# Patient Record
Sex: Female | Born: 1950 | Race: White | Hispanic: No | Marital: Married | Smoking: Never smoker
Health system: Southern US, Community
[De-identification: ages and names within clinical notes are randomized; demographics above are authoritative.]

## PROBLEM LIST (undated history)

## (undated) DIAGNOSIS — M1990 Unspecified osteoarthritis, unspecified site: Secondary | ICD-10-CM

## (undated) DIAGNOSIS — J45909 Unspecified asthma, uncomplicated: Secondary | ICD-10-CM

## (undated) HISTORY — PX: GASTRECTOMY: SHX58

## (undated) HISTORY — PX: FOOT SURGERY: SHX648

## (undated) HISTORY — PX: ABDOMINAL HYSTERECTOMY: SHX81

## (undated) HISTORY — PX: HAND SURGERY: SHX662

---

## 1999-01-19 ENCOUNTER — Encounter: Admission: RE | Admit: 1999-01-19 | Discharge: 1999-01-19 | Payer: Self-pay | Admitting: *Deleted

## 2008-12-23 ENCOUNTER — Encounter: Admission: RE | Admit: 2008-12-23 | Discharge: 2008-12-23 | Payer: Self-pay | Admitting: Unknown Physician Specialty

## 2010-06-13 IMAGING — CR DG HAND COMPLETE 3+V*L*
3 series · 3 of 3 positions shown · non-contrast
Comparison: None

CLINICAL DATA: Left hand pain and swelling.

LEFT HAND - COMPLETE 3+ VIEW

[view not recorded (1 of 3)]
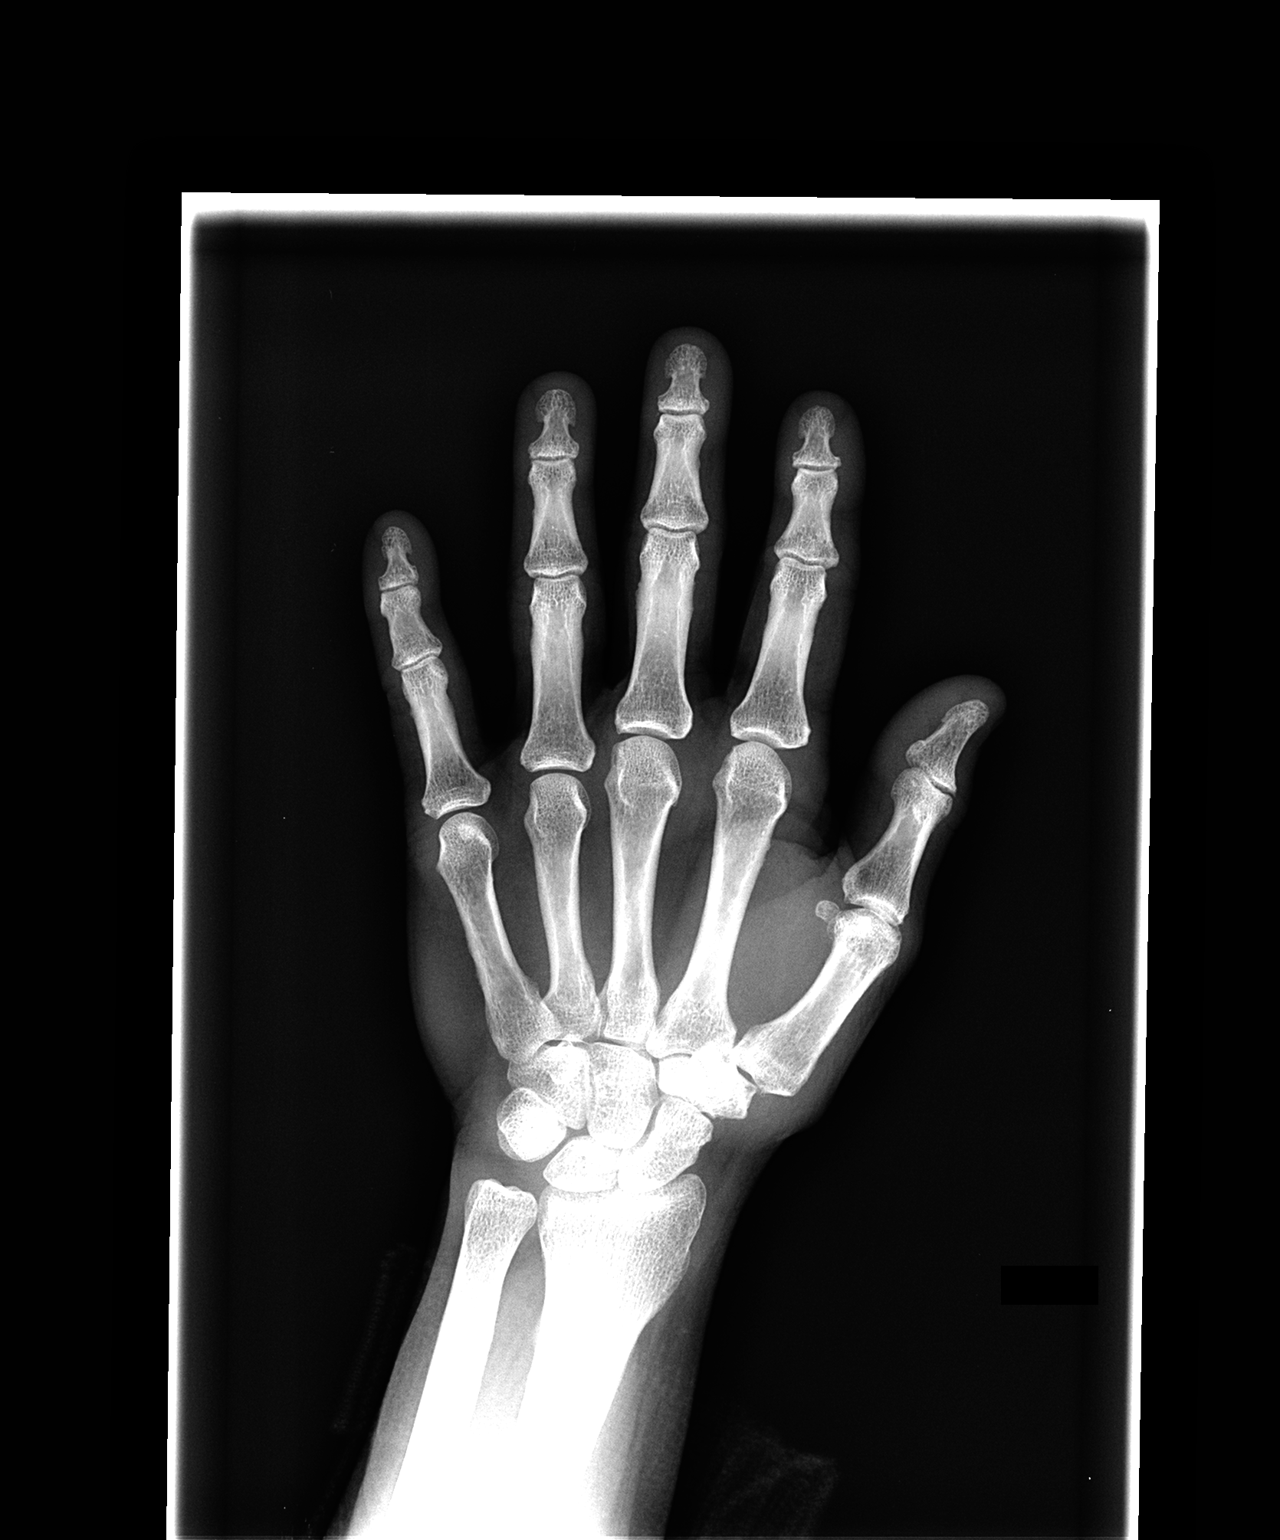

[view not recorded (2 of 3)]
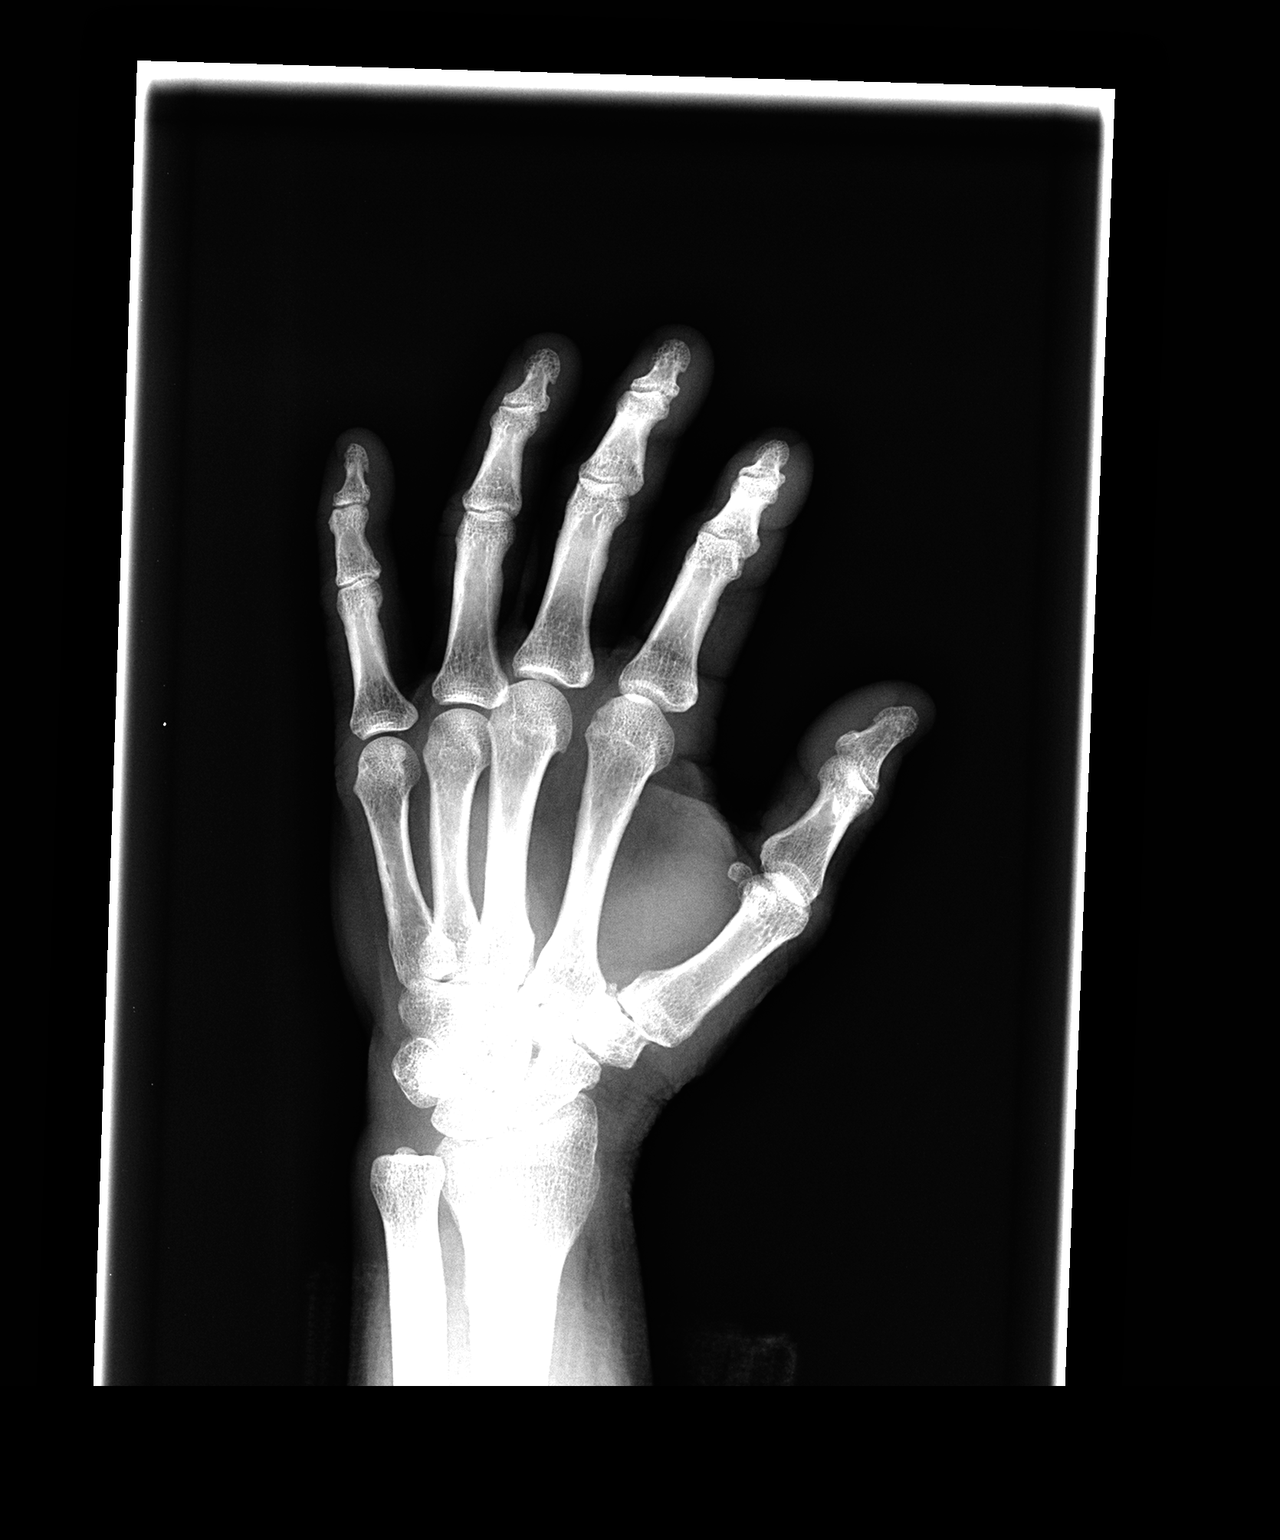

[view not recorded (3 of 3)]
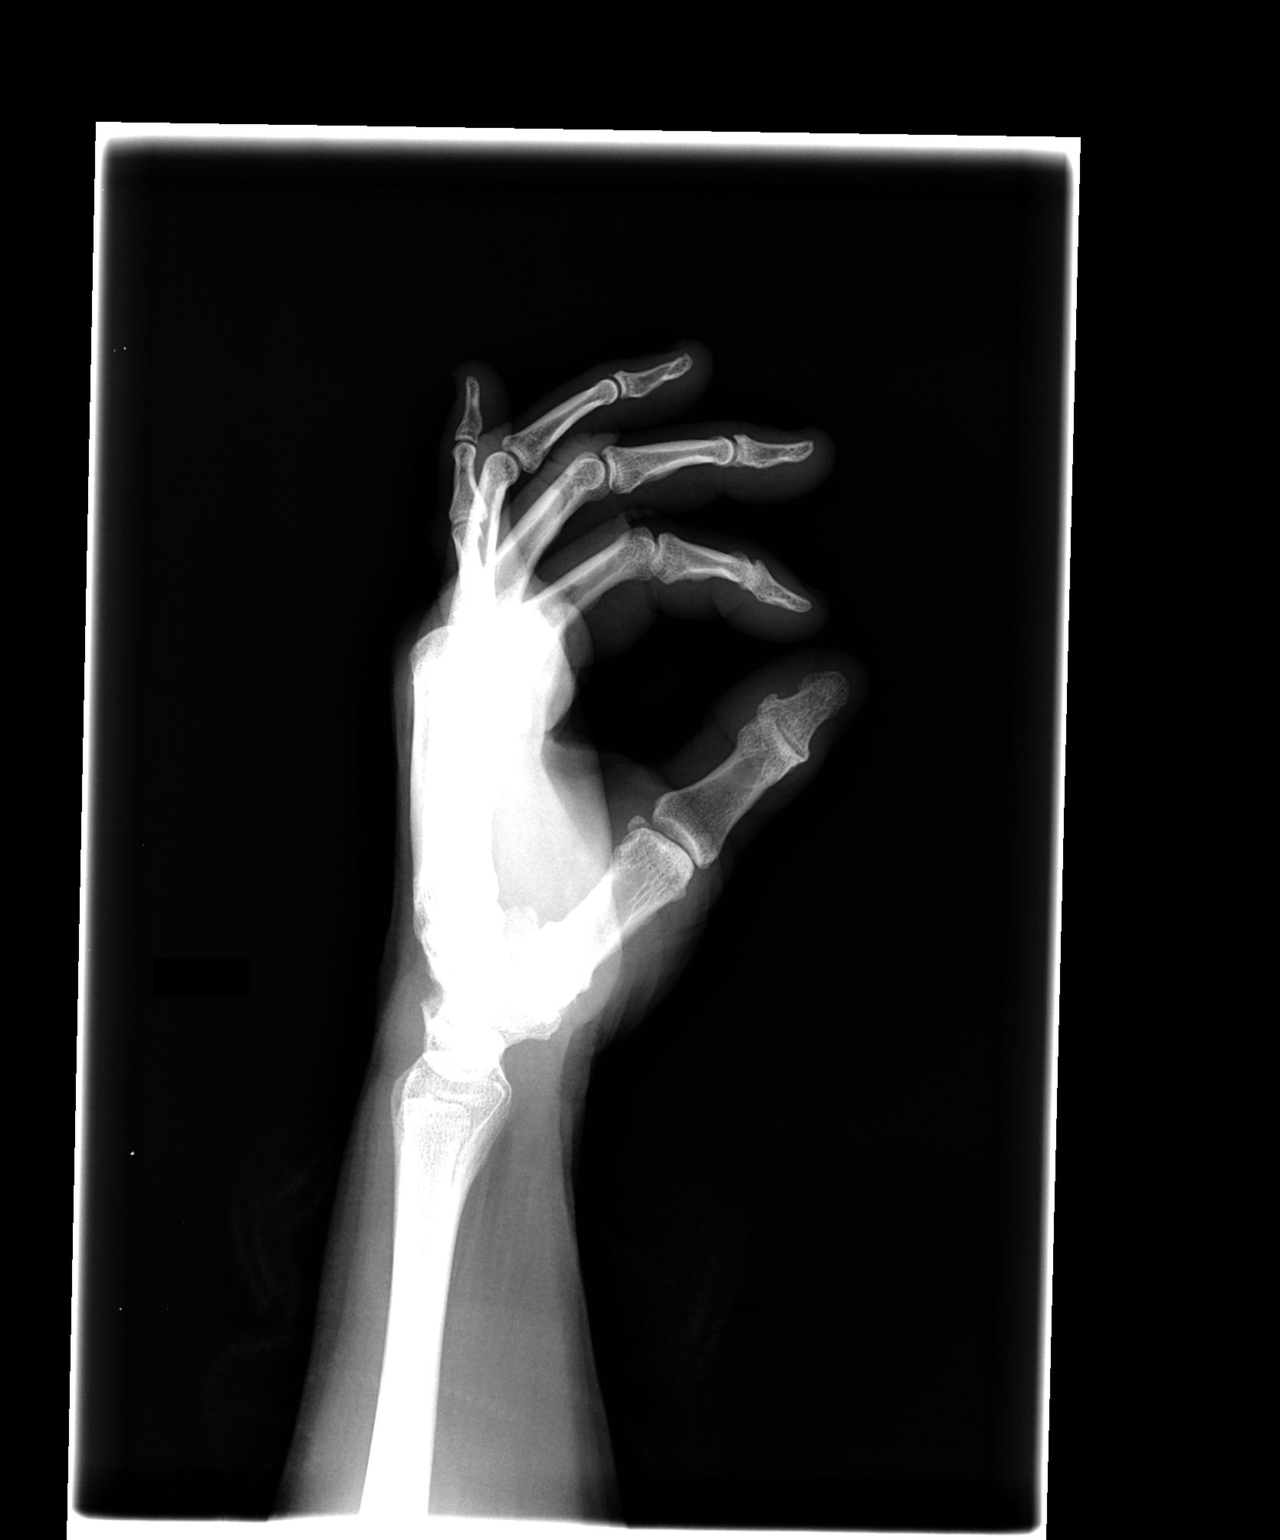

[3 of 3 positions shown; findings below may reference images not displayed]

FINDINGS: No acute osseous or joint abnormality.  There are mild
degenerative changes at the first carpometacarpal joint.
IMPRESSION: 1.  No acute findings.
2.  Mild first carpometacarpal joint osteoarthritis.

## 2014-11-29 ENCOUNTER — Emergency Department (INDEPENDENT_AMBULATORY_CARE_PROVIDER_SITE_OTHER): Payer: BC Managed Care – PPO

## 2014-11-29 ENCOUNTER — Encounter: Payer: Self-pay | Admitting: Emergency Medicine

## 2014-11-29 ENCOUNTER — Emergency Department
Admission: EM | Admit: 2014-11-29 | Discharge: 2014-11-29 | Disposition: A | Discharge status: Home / Self Care | Payer: BC Managed Care – PPO | Source: Home / Self Care | Attending: Family Medicine | Admitting: Family Medicine

## 2014-11-29 DIAGNOSIS — M5137 Other intervertebral disc degeneration, lumbosacral region: Secondary | ICD-10-CM

## 2014-11-29 DIAGNOSIS — S20211A Contusion of right front wall of thorax, initial encounter: Secondary | ICD-10-CM

## 2014-11-29 DIAGNOSIS — M545 Low back pain: Secondary | ICD-10-CM

## 2014-11-29 DIAGNOSIS — M5135 Other intervertebral disc degeneration, thoracolumbar region: Secondary | ICD-10-CM

## 2014-11-29 DIAGNOSIS — M47896 Other spondylosis, lumbar region: Secondary | ICD-10-CM

## 2014-11-29 DIAGNOSIS — R0781 Pleurodynia: Secondary | ICD-10-CM

## 2014-11-29 DIAGNOSIS — T1490XA Injury, unspecified, initial encounter: Secondary | ICD-10-CM

## 2014-11-29 DIAGNOSIS — T1490 Injury, unspecified: Secondary | ICD-10-CM

## 2014-11-29 HISTORY — DX: Unspecified osteoarthritis, unspecified site: M19.90

## 2014-11-29 HISTORY — DX: Unspecified asthma, uncomplicated: J45.909

## 2014-11-29 MED ORDER — KETOROLAC TROMETHAMINE 60 MG/2ML IM SOLN
30.0000 mg | Freq: Once | INTRAMUSCULAR | Status: AC
  Administered 2014-11-29: 30 mg via INTRAMUSCULAR

## 2014-11-29 MED ORDER — BENZONATATE 200 MG PO CAPS: 200.0000 mg | ORAL_CAPSULE | Freq: Every day | ORAL | Status: AC

## 2014-11-29 NOTE — Discharge Instructions (Signed)
Wear rib belt.  Apply ice pack for 20 to 30 minutes, 3 to 4 times daily  Continue until pain decreases.  May take Tylenol for pain.   Chest Contusion A chest contusion is a deep bruise on your chest area. Contusions are the result of an injury that caused bleeding under the skin. A chest contusion may involve bruising of the skin, muscles, or ribs. The contusion may turn blue, purple, or yellow. Minor injuries will give you a painless contusion, but more severe contusions may stay painful and swollen for a few weeks. CAUSES  A contusion is usually caused by a blow, trauma, or direct force to an area of the body. SYMPTOMS   Swelling and redness of the injured area.  Discoloration of the injured area.  Tenderness and soreness of the injured area.  Pain. DIAGNOSIS  The diagnosis can be made by taking a history and performing a physical exam. An X-ray, CT scan, or MRI may be needed to determine if there were any associated injuries, such as broken bones (fractures) or internal injuries. TREATMENT  Often, the best treatment for a chest contusion is resting, icing, and applying cold compresses to the injured area. Deep breathing exercises may be recommended to reduce the risk of pneumonia. Over-the-counter medicines may also be recommended for pain control. HOME CARE INSTRUCTIONS   Put ice on the injured area.  Put ice in a plastic bag.  Place a towel between your skin and the bag.  Leave the ice on for 15-20 minutes, 03-04 times a day.  Only take over-the-counter or prescription medicines as directed by your caregiver. Your caregiver may recommend avoiding anti-inflammatory medicines (aspirin, ibuprofen, and naproxen) for 48 hours because these medicines may increase bruising.  Rest the injured area.  Perform deep-breathing exercises as directed by your caregiver.  Stop smoking if you smoke.  Do not lift objects over 5 pounds (2.3 kg) for 3 days or longer if recommended by your  caregiver. SEEK IMMEDIATE MEDICAL CARE IF:   You have increased bruising or swelling.  You have pain that is getting worse.  You have difficulty breathing.  You have dizziness, weakness, or fainting.  You have blood in your urine or stool.  You cough up or vomit blood.  Your swelling or pain is not relieved with medicines. MAKE SURE YOU:   Understand these instructions.  Will watch your condition.  Will get help right away if you are not doing well or get worse. Document Released: 09/05/2001 Document Revised: 09/04/2012 Document Reviewed: 06/03/2012 [facility] Patient Information 2015 [place], [place]. This information is not intended to replace advice given to you by your health care provider. Make sure you discuss any questions you have with your health care provider.

## 2014-11-29 NOTE — ED Notes (Signed)
Reports falling down stairs during move session yesterday; pain along right side of pain has gotten progressively worse; took acetominophen 1 GM this morning.

## 2014-11-29 NOTE — ED Provider Notes (Signed)
CSN: [id number]     Arrival date & time 11/29/14  1143 History   First MD Initiated Contact with Patient 11/29/14 1258     Chief Complaint  Patient presents with  . Back Pain      HPI Comments: Patient slipped down about 6 stair treads yesterday landing on her right side, back, shoulder, and right lower ribs.  Last night she felt a popping sensation in her right chest and now has pain that is worse with movement and coughing.  No shortness of breath.  No fevers, chills, and sweats       Patient is a 63 y.o. female presenting with chest pain. The history is provided by the patient.  Chest Pain Pain location:  R lateral chest Pain quality: sharp and stabbing   Pain radiates to:  Does not radiate Pain severity:  Moderate Onset quality:  Sudden Duration:  1 day Timing:  Constant Progression:  Unchanged Chronicity:  New Context: breathing, lifting, movement and at rest   Relieved by:  Nothing Worsened by:  Coughing, movement and deep breathing Ineffective treatments: Tylenol. Associated symptoms: cough   Associated symptoms: no abdominal pain, no back pain, no diaphoresis, no dizziness, no dysphagia, no fever, no nausea, no numbness, no palpitations and no shortness of breath     Past Medical History  Diagnosis Date  . Arthritis   . Asthma    Past Surgical History  Procedure Laterality Date  . Hand surgery Left   . Abdominal hysterectomy    . Foot surgery Bilateral   . Gastrectomy     Family History  Problem Relation Age of Onset  . Cancer Mother   . Heart failure Father    History  Substance Use Topics  . Smoking status: Never Smoker   . Smokeless tobacco: Not on file  . Alcohol Use: No   OB History    No data available     Review of Systems  Constitutional: Negative for fever and diaphoresis.  HENT: Negative for trouble swallowing.   Respiratory: Positive for cough. Negative for shortness of breath.   Cardiovascular: Positive for chest pain. Negative for  palpitations.  Gastrointestinal: Negative for nausea and abdominal pain.  Musculoskeletal: Negative for back pain.  Neurological: Negative for dizziness and numbness.  All other systems reviewed and are negative.   Allergies  Phenothiazines  Home Medications   Prior to Admission medications   Medication Sig Start Date End Date Taking? Authorizing Provider  alendronate (FOSAMAX) 70 MG tablet Take 70 mg by mouth once a week. Take with a full glass of water on an empty stomach.   Yes Historical Provider, MD  celecoxib (CELEBREX) 200 MG capsule Take 200 mg by mouth 2 (two) times daily.   Yes Historical Provider, MD  fluticasone (FLOVENT DISKUS) 50 MCG/BLIST diskus inhaler Inhale 1 puff into the lungs 2 (two) times daily.   Yes Historical Provider, MD  montelukast (SINGULAIR) 10 MG tablet Take 10 mg by mouth at bedtime.   Yes Historical Provider, MD  simvastatin (ZOCOR) 40 MG tablet Take 40 mg by mouth daily.   Yes Historical Provider, MD  benzonatate (TESSALON) 200 MG capsule Take 1 capsule (200 mg total) by mouth at bedtime. Take as needed for cough 11/29/14   Kandra Nicolas, MD   BP 160/91 mmHg  Pulse 70  Temp(Src) 98.1 F (36.7 C) (Oral)  Resp 18  Ht 5' (1.524 m)  Wt 140 lb (63.504 kg)  BMI 27.34 kg/m2  SpO2  99% Physical Exam  Constitutional: She is oriented to person, place, and time. She appears well-developed and well-nourished. No distress.  HENT:  Head: Normocephalic and atraumatic.  Eyes: Conjunctivae are normal. Pupils are equal, round, and reactive to light.  Neck: Normal range of motion.  Cardiovascular: Normal heart sounds.   Pulmonary/Chest: Effort normal and breath sounds normal. No respiratory distress.      Right chest tenderness to palpation as noted on diagram.  No swelling, ecchymosis, or crepitance.    Abdominal: Bowel sounds are normal. There is no tenderness.  Musculoskeletal: She exhibits no edema.  Lymphadenopathy:    She has no cervical adenopathy.   Neurological: She is alert and oriented to person, place, and time.  Skin: Skin is warm and dry. No rash noted.  Nursing note and vitals reviewed.   ED Course  Procedures  none  Imaging Review Dg Ribs Unilateral W/chest Right  11/29/2014   CLINICAL DATA:  Right posterior back pain status post fall  EXAM: RIGHT RIBS AND CHEST - 3+ VIEW  COMPARISON:  None.  FINDINGS: No fracture or other bone lesions are seen involving the ribs. There is no evidence of pneumothorax or pleural effusion. Both lungs are clear. Heart size and mediastinal contours are within normal limits.  IMPRESSION: Negative.   Electronically Signed   By: Kathreen Devoid   On: 11/29/2014 14:47   Dg Lumbar Spine Complete  11/29/2014   CLINICAL DATA:  63 year old female with history of trauma from a fall yesterday complaining of low back pain since that time.  EXAM: LUMBAR SPINE - COMPLETE 4+ VIEW  COMPARISON:  No priors.  FINDINGS: Multiple views of the lumbar spine demonstrate no definite acute displaced fracture or compression type fracture. Alignment is anatomic. Multilevel degenerative disc disease, most severe at T12-L1. Mild multilevel facet arthropathy, most severe at L5-S1.  IMPRESSION: 1. No acute radiographic abnormality of the lumbar spine. 2. Mild multilevel degenerative disc disease and lumbar spondylosis.   Electronically Signed   By: Vinnie Langton M.D.   On: 11/29/2014 14:02     MDM   1. Contusion of right chest wall, initial encounter   2. Injury   3. Rib pain on right side    Dispensed rib belt.  Prescription written for Benzonatate [facility]) to take at bedtime for night-time cough.  Wear rib belt.  Apply ice pack for 20 to 30 minutes, 3 to 4 times daily  Continue until pain decreases.  May take Tylenol for pain. Followup with Family Doctor if not improved in about two weeks.    Kandra Nicolas, MD 12/02/14 [phone]

## 2014-12-04 ENCOUNTER — Telehealth: Payer: Self-pay | Admitting: Emergency Medicine

## 2015-05-31 DIAGNOSIS — L239 Allergic contact dermatitis, unspecified cause: Secondary | ICD-10-CM | POA: Diagnosis not present

## 2015-05-31 DIAGNOSIS — L570 Actinic keratosis: Secondary | ICD-10-CM | POA: Diagnosis not present

## 2015-06-01 DIAGNOSIS — G5602 Carpal tunnel syndrome, left upper limb: Secondary | ICD-10-CM | POA: Diagnosis not present

## 2015-06-01 DIAGNOSIS — M19042 Primary osteoarthritis, left hand: Secondary | ICD-10-CM | POA: Diagnosis not present

## 2015-06-01 DIAGNOSIS — M79642 Pain in left hand: Secondary | ICD-10-CM | POA: Diagnosis not present

## 2015-06-04 DIAGNOSIS — G5602 Carpal tunnel syndrome, left upper limb: Secondary | ICD-10-CM | POA: Diagnosis not present

## 2015-06-08 DIAGNOSIS — R920 Mammographic microcalcification found on diagnostic imaging of breast: Secondary | ICD-10-CM | POA: Diagnosis not present

## 2015-06-08 DIAGNOSIS — R928 Other abnormal and inconclusive findings on diagnostic imaging of breast: Secondary | ICD-10-CM | POA: Diagnosis not present

## 2015-07-07 DIAGNOSIS — M79642 Pain in left hand: Secondary | ICD-10-CM | POA: Diagnosis not present

## 2015-07-14 DIAGNOSIS — Z01419 Encounter for gynecological examination (general) (routine) without abnormal findings: Secondary | ICD-10-CM | POA: Diagnosis not present

## 2015-07-14 DIAGNOSIS — Z1289 Encounter for screening for malignant neoplasm of other sites: Secondary | ICD-10-CM | POA: Diagnosis not present

## 2015-07-14 DIAGNOSIS — J45909 Unspecified asthma, uncomplicated: Secondary | ICD-10-CM | POA: Diagnosis not present

## 2015-07-14 DIAGNOSIS — R5383 Other fatigue: Secondary | ICD-10-CM | POA: Diagnosis not present

## 2015-07-14 DIAGNOSIS — M8580 Other specified disorders of bone density and structure, unspecified site: Secondary | ICD-10-CM | POA: Diagnosis not present

## 2015-07-14 DIAGNOSIS — M899 Disorder of bone, unspecified: Secondary | ICD-10-CM | POA: Diagnosis not present

## 2015-07-14 DIAGNOSIS — K5900 Constipation, unspecified: Secondary | ICD-10-CM | POA: Diagnosis not present

## 2015-07-14 DIAGNOSIS — E780 Pure hypercholesterolemia: Secondary | ICD-10-CM | POA: Diagnosis not present

## 2015-07-14 DIAGNOSIS — Z23 Encounter for immunization: Secondary | ICD-10-CM | POA: Diagnosis not present

## 2015-07-26 DIAGNOSIS — Z79899 Other long term (current) drug therapy: Secondary | ICD-10-CM | POA: Diagnosis not present

## 2015-07-26 DIAGNOSIS — Z1211 Encounter for screening for malignant neoplasm of colon: Secondary | ICD-10-CM | POA: Diagnosis not present

## 2015-07-26 DIAGNOSIS — E875 Hyperkalemia: Secondary | ICD-10-CM | POA: Diagnosis not present

## 2015-08-31 DIAGNOSIS — Z23 Encounter for immunization: Secondary | ICD-10-CM | POA: Diagnosis not present

## 2015-09-10 DIAGNOSIS — M25551 Pain in right hip: Secondary | ICD-10-CM | POA: Diagnosis not present

## 2015-09-15 DIAGNOSIS — M461 Sacroiliitis, not elsewhere classified: Secondary | ICD-10-CM | POA: Diagnosis not present

## 2015-09-29 DIAGNOSIS — M461 Sacroiliitis, not elsewhere classified: Secondary | ICD-10-CM | POA: Diagnosis not present

## 2015-09-29 DIAGNOSIS — M25551 Pain in right hip: Secondary | ICD-10-CM | POA: Diagnosis not present

## 2015-10-06 DIAGNOSIS — M4806 Spinal stenosis, lumbar region: Secondary | ICD-10-CM | POA: Diagnosis not present

## 2015-10-06 DIAGNOSIS — M545 Low back pain: Secondary | ICD-10-CM | POA: Diagnosis not present

## 2015-10-06 DIAGNOSIS — M4696 Unspecified inflammatory spondylopathy, lumbar region: Secondary | ICD-10-CM | POA: Diagnosis not present

## 2015-10-12 DIAGNOSIS — M5416 Radiculopathy, lumbar region: Secondary | ICD-10-CM | POA: Diagnosis not present

## 2015-10-12 DIAGNOSIS — M461 Sacroiliitis, not elsewhere classified: Secondary | ICD-10-CM | POA: Diagnosis not present

## 2015-10-12 DIAGNOSIS — M545 Low back pain: Secondary | ICD-10-CM | POA: Diagnosis not present

## 2015-11-03 DIAGNOSIS — H5203 Hypermetropia, bilateral: Secondary | ICD-10-CM | POA: Diagnosis not present

## 2015-11-03 DIAGNOSIS — H52222 Regular astigmatism, left eye: Secondary | ICD-10-CM | POA: Diagnosis not present

## 2015-11-03 DIAGNOSIS — H2513 Age-related nuclear cataract, bilateral: Secondary | ICD-10-CM | POA: Diagnosis not present

## 2015-11-03 DIAGNOSIS — H524 Presbyopia: Secondary | ICD-10-CM | POA: Diagnosis not present

## 2015-12-14 DIAGNOSIS — J309 Allergic rhinitis, unspecified: Secondary | ICD-10-CM | POA: Diagnosis not present

## 2015-12-25 DIAGNOSIS — Z79899 Other long term (current) drug therapy: Secondary | ICD-10-CM | POA: Diagnosis not present

## 2015-12-25 DIAGNOSIS — L03114 Cellulitis of left upper limb: Secondary | ICD-10-CM | POA: Diagnosis not present

## 2015-12-25 DIAGNOSIS — S61452A Open bite of left hand, initial encounter: Secondary | ICD-10-CM | POA: Diagnosis not present

## 2015-12-25 DIAGNOSIS — Z7982 Long term (current) use of aspirin: Secondary | ICD-10-CM | POA: Diagnosis not present

## 2015-12-25 DIAGNOSIS — Z885 Allergy status to narcotic agent status: Secondary | ICD-10-CM | POA: Diagnosis not present

## 2015-12-25 DIAGNOSIS — W5501XA Bitten by cat, initial encounter: Secondary | ICD-10-CM | POA: Diagnosis not present

## 2015-12-25 DIAGNOSIS — J45909 Unspecified asthma, uncomplicated: Secondary | ICD-10-CM | POA: Diagnosis not present

## 2015-12-25 DIAGNOSIS — Z888 Allergy status to other drugs, medicaments and biological substances status: Secondary | ICD-10-CM | POA: Diagnosis not present

## 2015-12-25 DIAGNOSIS — M1990 Unspecified osteoarthritis, unspecified site: Secondary | ICD-10-CM | POA: Diagnosis not present

## 2015-12-25 DIAGNOSIS — M810 Age-related osteoporosis without current pathological fracture: Secondary | ICD-10-CM | POA: Diagnosis not present

## 2016-01-13 DIAGNOSIS — J309 Allergic rhinitis, unspecified: Secondary | ICD-10-CM | POA: Diagnosis not present

## 2016-01-13 DIAGNOSIS — M5030 Other cervical disc degeneration, unspecified cervical region: Secondary | ICD-10-CM | POA: Diagnosis not present

## 2016-01-13 DIAGNOSIS — M8580 Other specified disorders of bone density and structure, unspecified site: Secondary | ICD-10-CM | POA: Diagnosis not present

## 2016-01-13 DIAGNOSIS — E7800 Pure hypercholesterolemia, unspecified: Secondary | ICD-10-CM | POA: Diagnosis not present

## 2016-01-19 DIAGNOSIS — R922 Inconclusive mammogram: Secondary | ICD-10-CM | POA: Diagnosis not present

## 2016-01-19 DIAGNOSIS — R928 Other abnormal and inconclusive findings on diagnostic imaging of breast: Secondary | ICD-10-CM | POA: Diagnosis not present

## 2016-01-19 DIAGNOSIS — E875 Hyperkalemia: Secondary | ICD-10-CM | POA: Diagnosis not present

## 2016-01-19 DIAGNOSIS — R921 Mammographic calcification found on diagnostic imaging of breast: Secondary | ICD-10-CM | POA: Diagnosis not present

## 2016-01-19 DIAGNOSIS — Z09 Encounter for follow-up examination after completed treatment for conditions other than malignant neoplasm: Secondary | ICD-10-CM | POA: Diagnosis not present

## 2016-05-19 IMAGING — CR DG LUMBAR SPINE COMPLETE 4+V
5 series · 5 of 5 positions shown · non-contrast
Comparison: No priors.

CLINICAL DATA: 63-year-old female with history of trauma from a
fall yesterday complaining of low back pain since that time.

EXAM:
LUMBAR SPINE - COMPLETE 4+ VIEW

[view not recorded (1 of 5)]
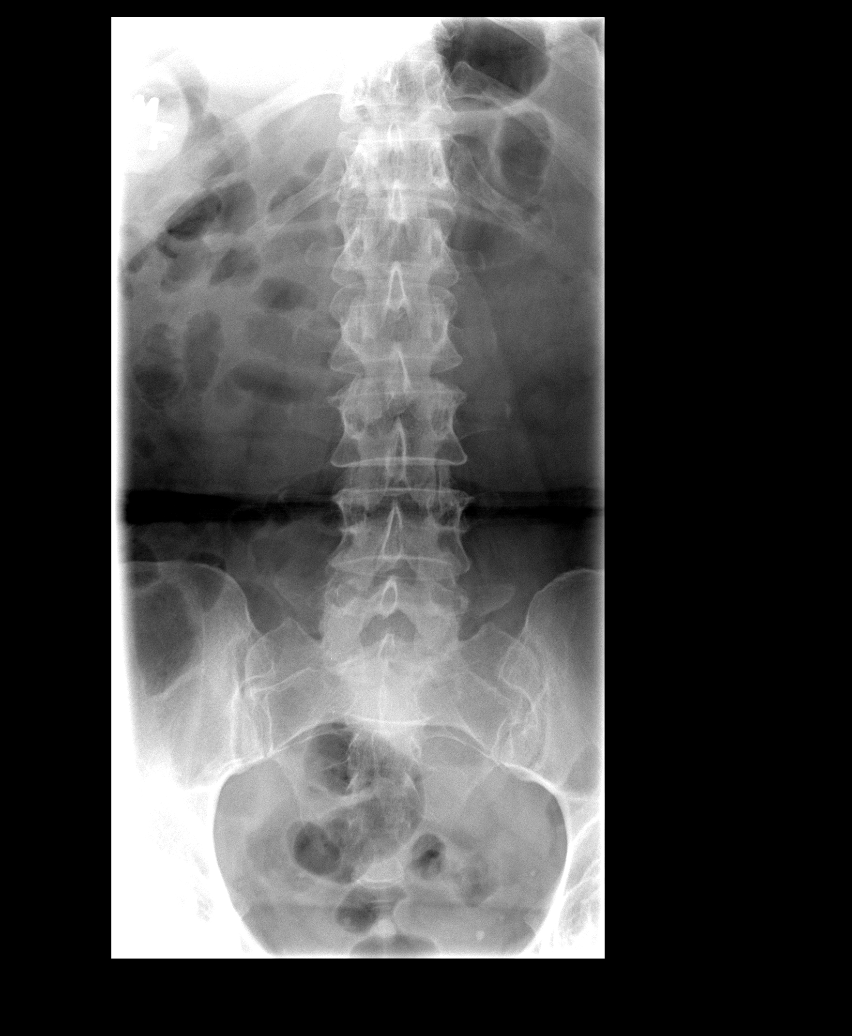

[view not recorded (2 of 5)]
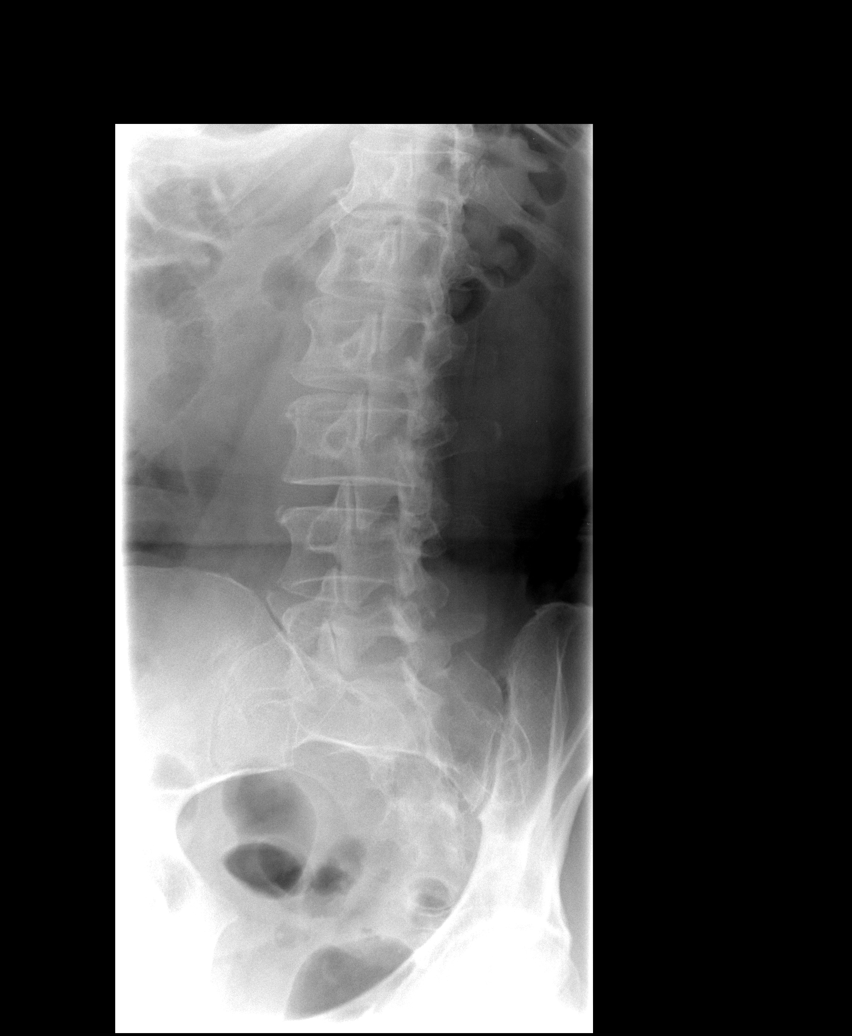

[view not recorded (3 of 5)]
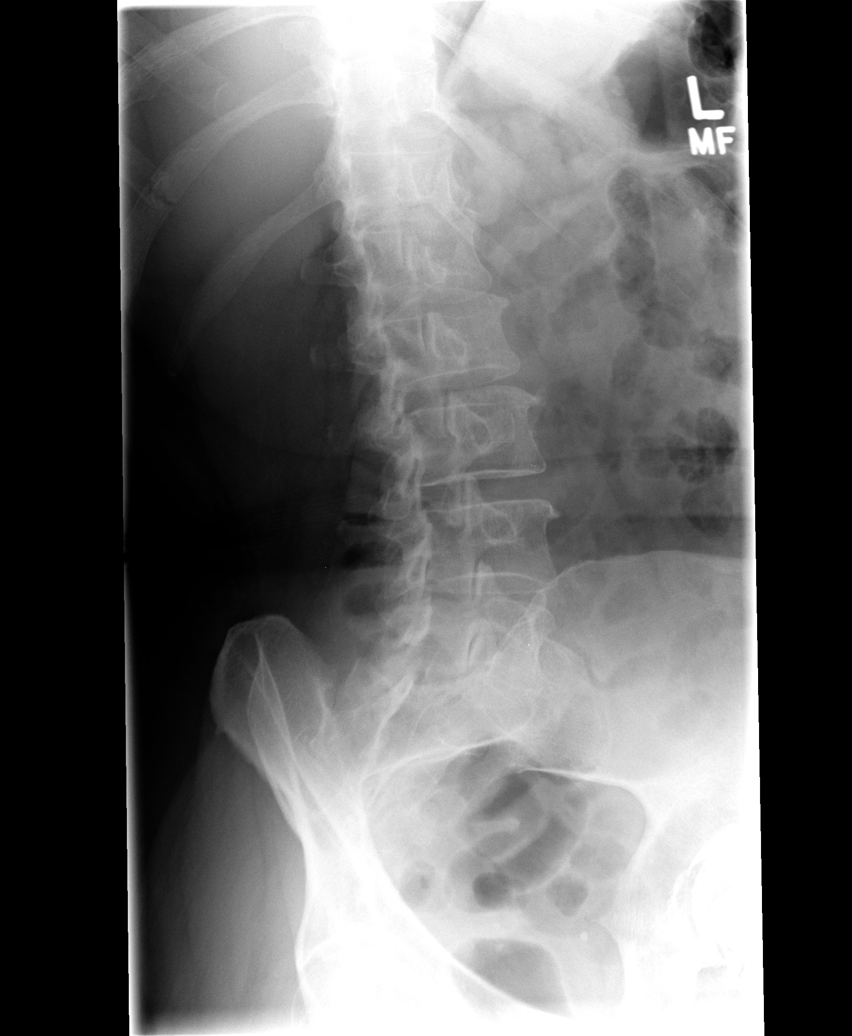

[view not recorded (4 of 5)]
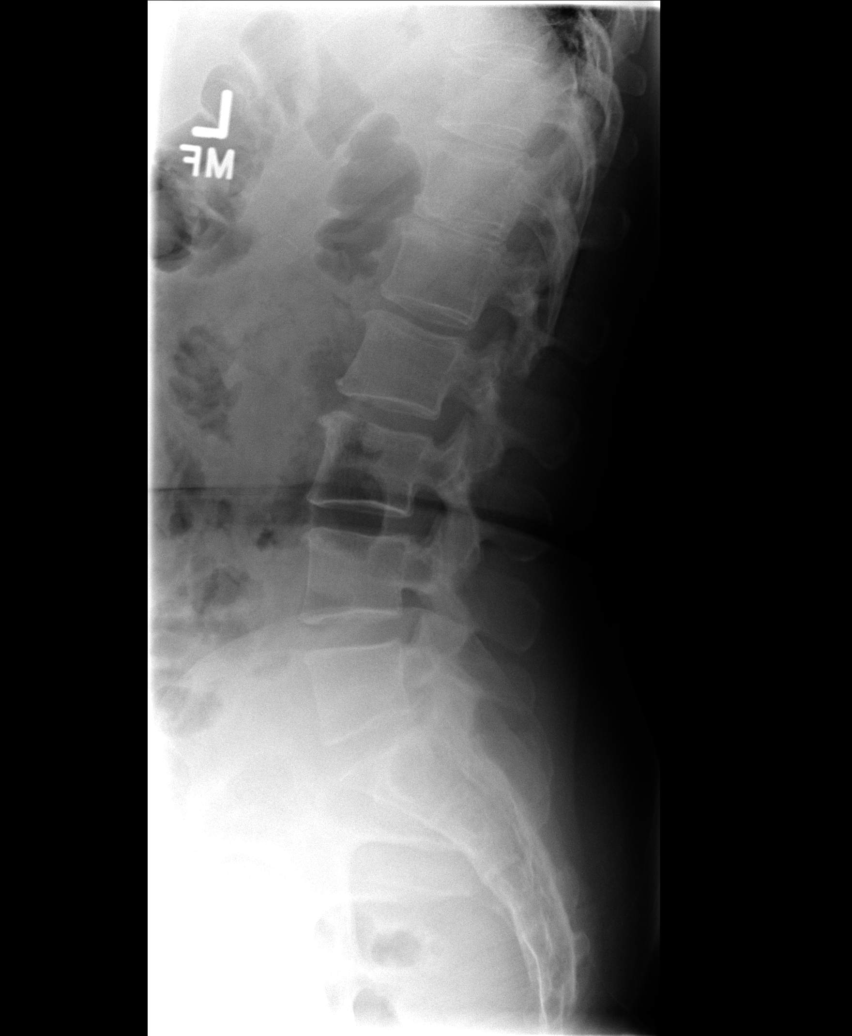

[view not recorded (5 of 5)]
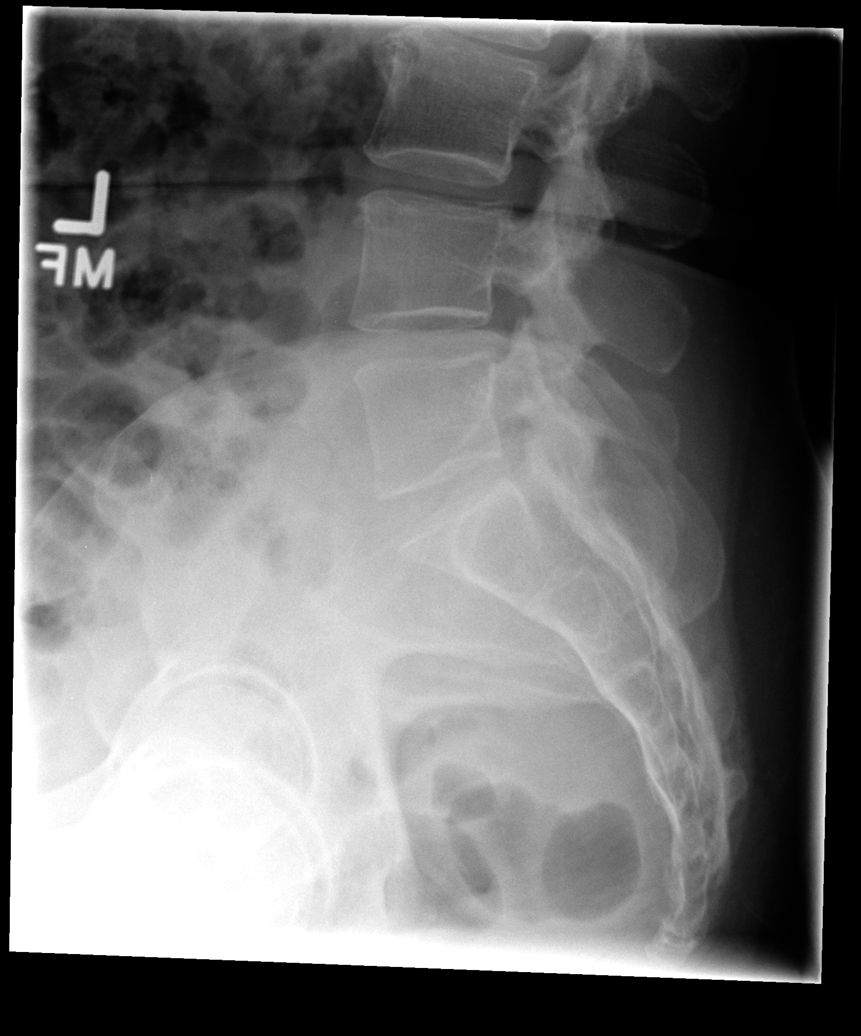

[5 of 5 positions shown; findings below may reference images not displayed]

FINDINGS: Multiple views of the lumbar spine demonstrate no definite acute
displaced fracture or compression type fracture. Alignment is
anatomic. Multilevel degenerative disc disease, most severe at
T12-L1. Mild multilevel facet arthropathy, most severe at L5-S1.
IMPRESSION: 1. No acute radiographic abnormality of the lumbar spine.
2. Mild multilevel degenerative disc disease and lumbar spondylosis.

## 2016-05-19 IMAGING — CR DG RIBS W/ CHEST 3+V*R*
3 series · 3 of 3 positions shown · non-contrast
Comparison: None.

CLINICAL DATA: Right posterior back pain status post fall

EXAM:
RIGHT RIBS AND CHEST - 3+ VIEW

[view not recorded (1 of 3)]
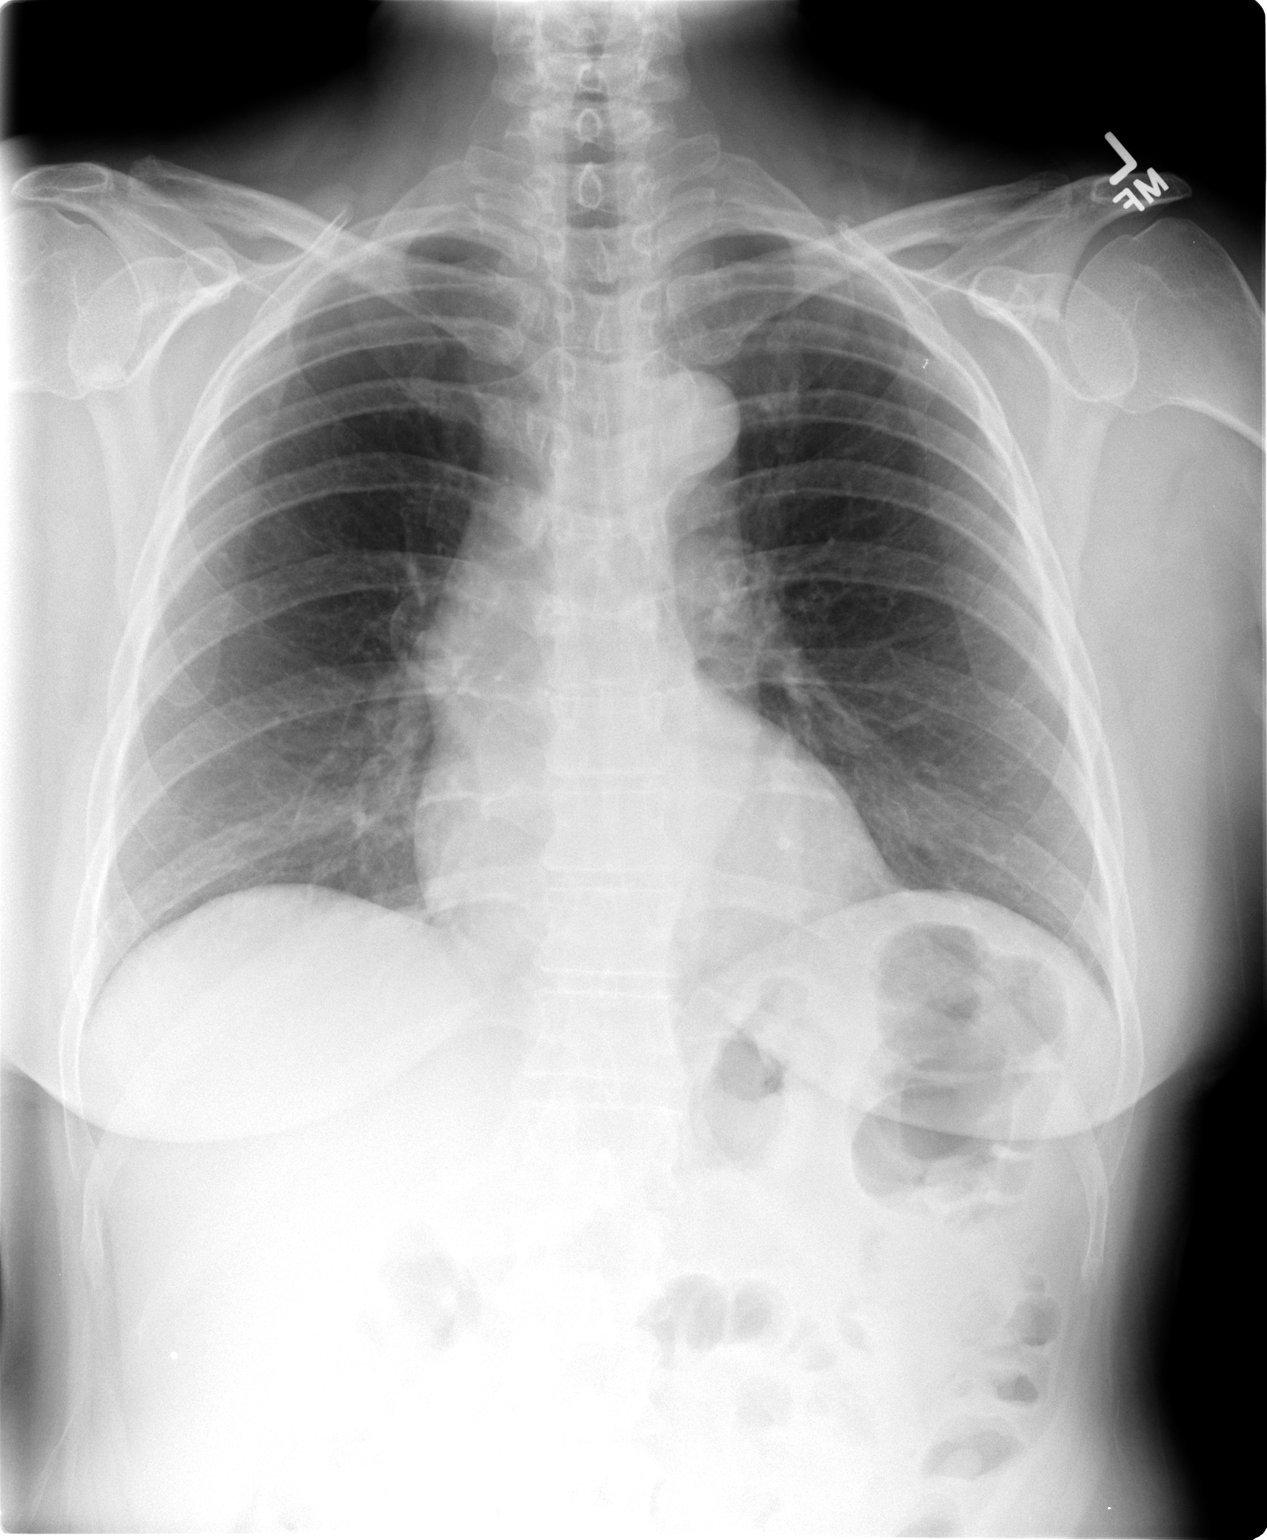

[view not recorded (2 of 3)]
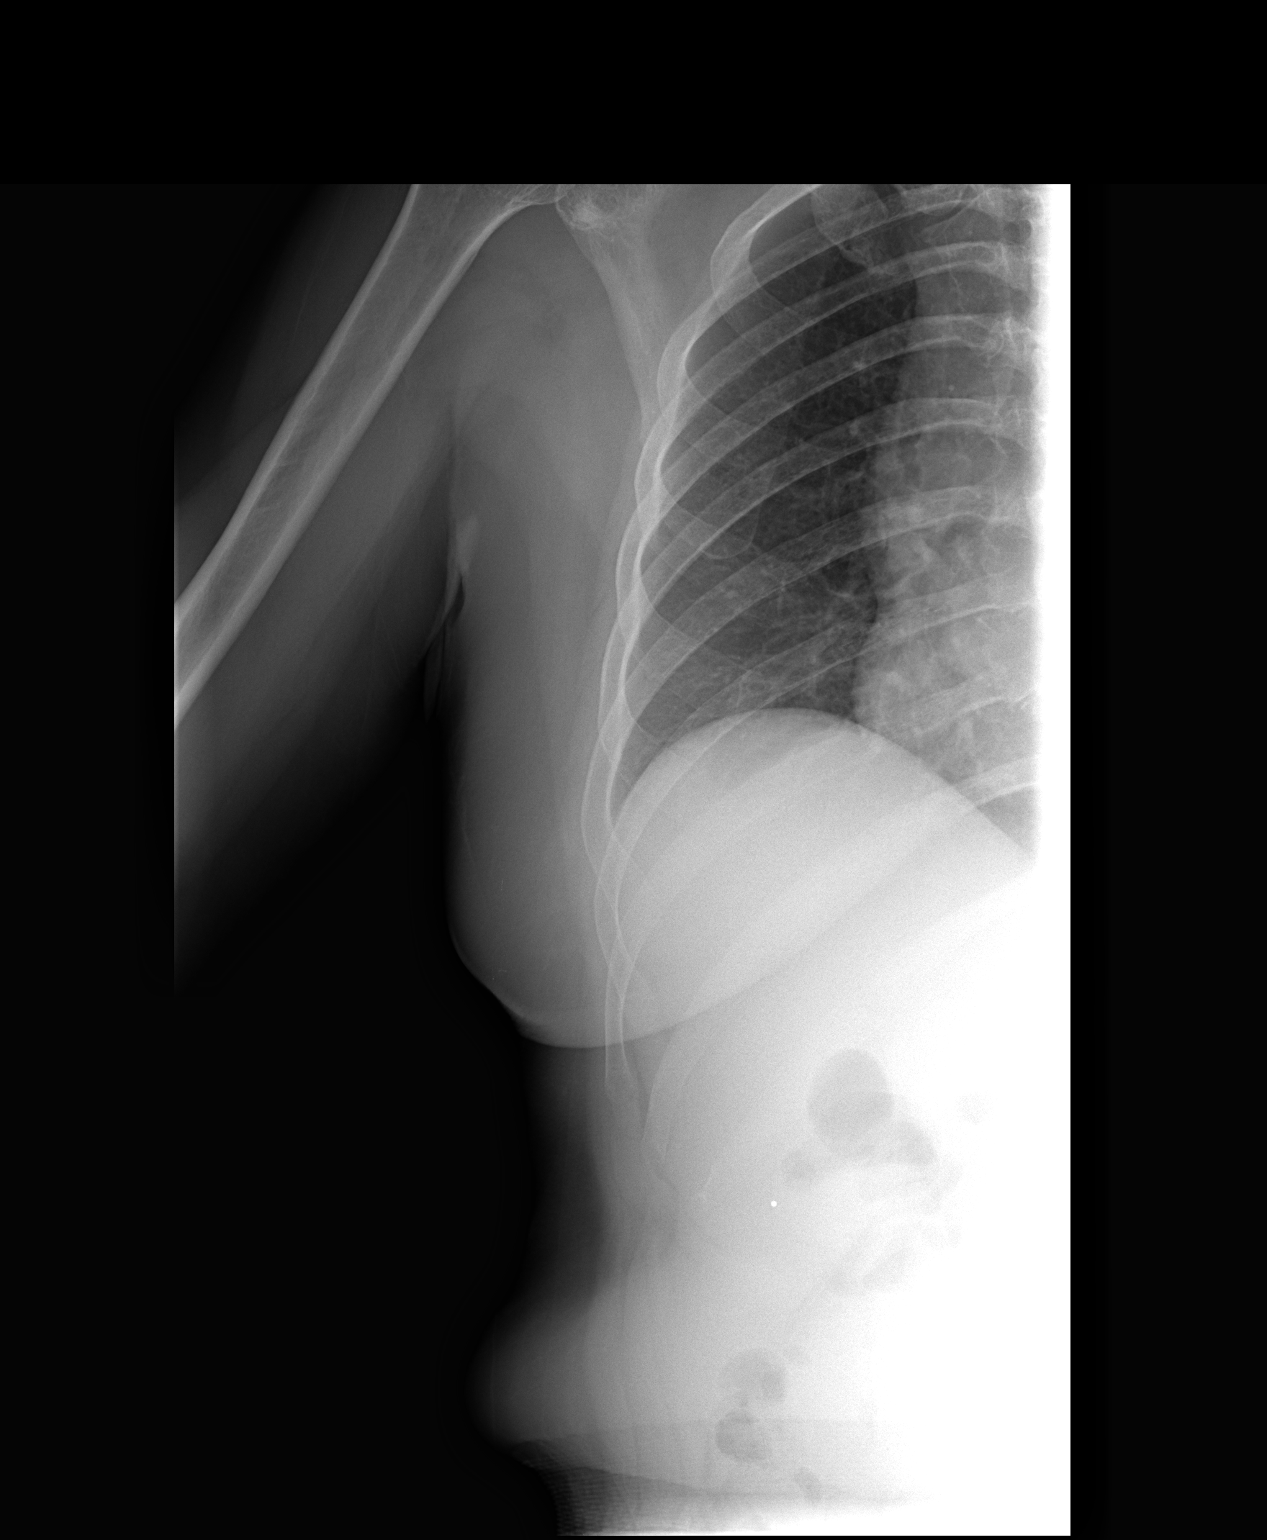

[view not recorded (3 of 3)]
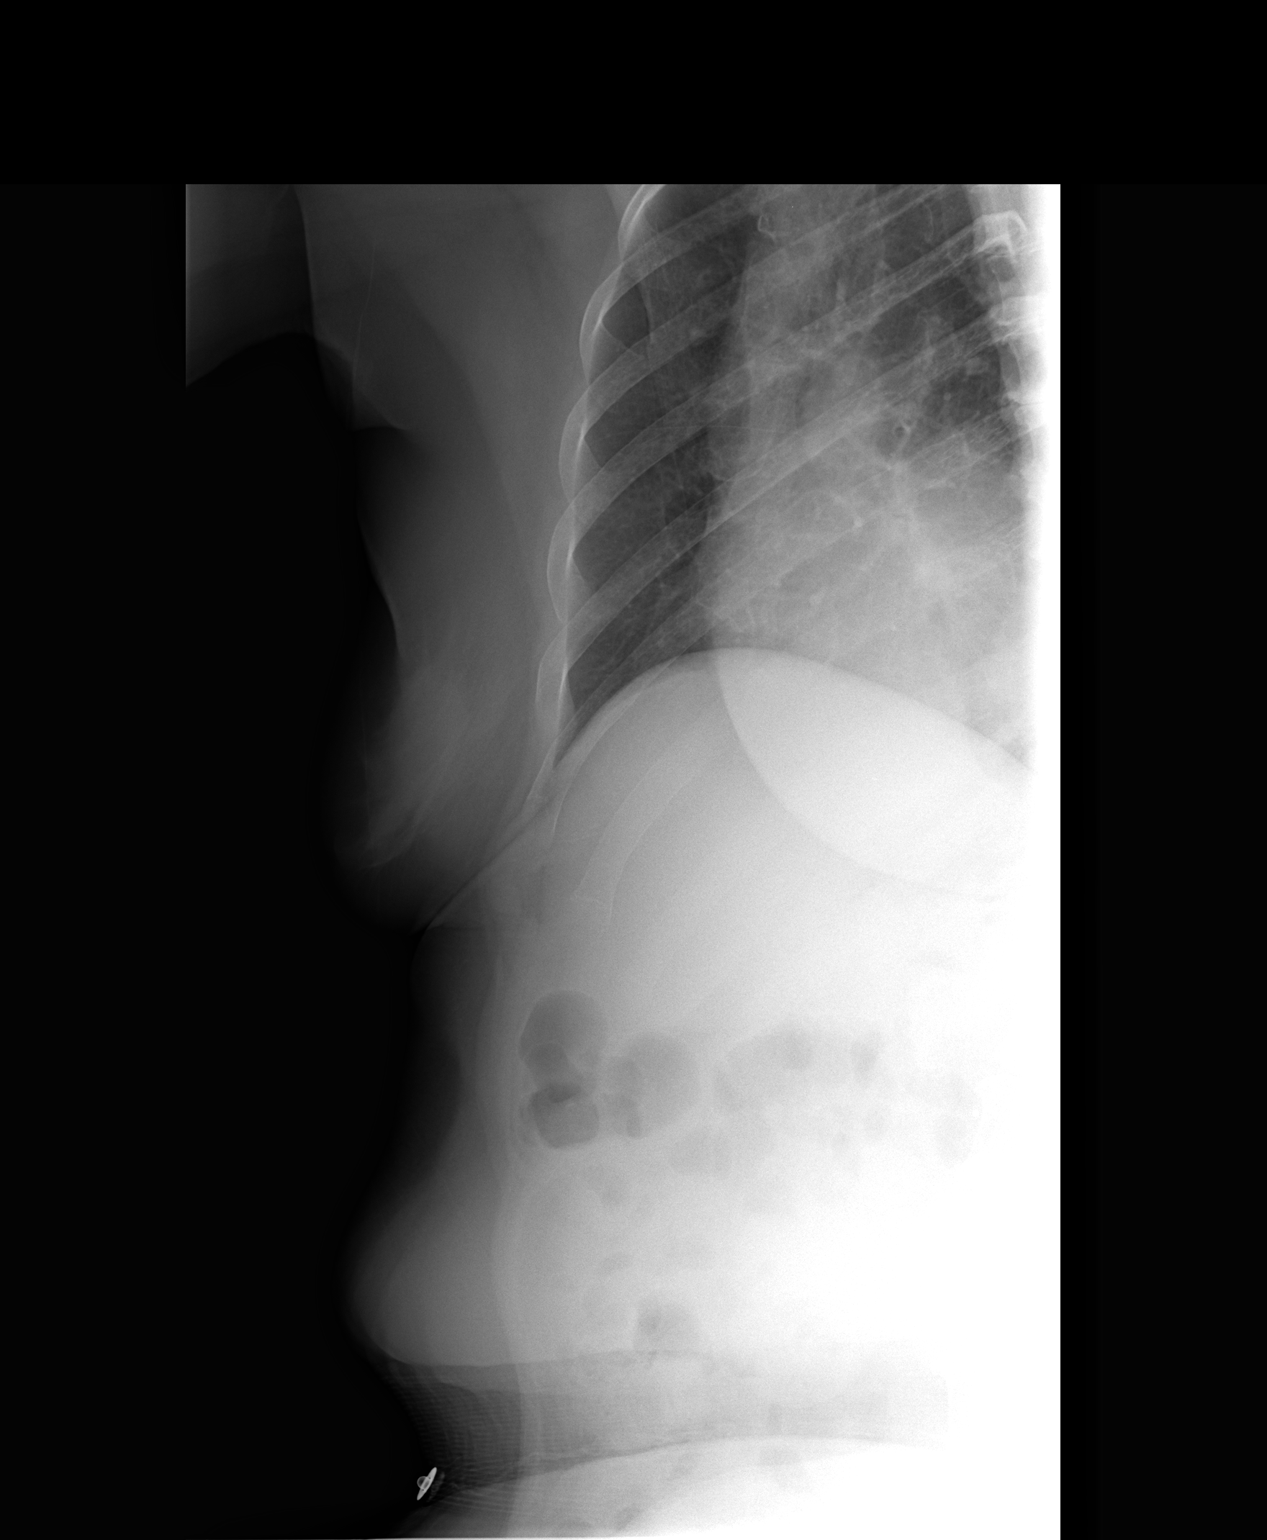

[3 of 3 positions shown; findings below may reference images not displayed]

FINDINGS: No fracture or other bone lesions are seen involving the ribs. There
is no evidence of pneumothorax or pleural effusion. Both lungs are
clear. Heart size and mediastinal contours are within normal limits.
IMPRESSION: Negative.

## 2016-05-23 DIAGNOSIS — M461 Sacroiliitis, not elsewhere classified: Secondary | ICD-10-CM | POA: Diagnosis not present

## 2016-05-23 DIAGNOSIS — M5416 Radiculopathy, lumbar region: Secondary | ICD-10-CM | POA: Diagnosis not present

## 2016-05-23 DIAGNOSIS — M25551 Pain in right hip: Secondary | ICD-10-CM | POA: Diagnosis not present

## 2016-05-23 DIAGNOSIS — M545 Low back pain: Secondary | ICD-10-CM | POA: Diagnosis not present

## 2016-05-30 DIAGNOSIS — L570 Actinic keratosis: Secondary | ICD-10-CM | POA: Diagnosis not present

## 2016-05-30 DIAGNOSIS — D225 Melanocytic nevi of trunk: Secondary | ICD-10-CM | POA: Diagnosis not present

## 2016-06-12 DIAGNOSIS — M461 Sacroiliitis, not elsewhere classified: Secondary | ICD-10-CM | POA: Diagnosis not present

## 2016-07-07 DIAGNOSIS — J9801 Acute bronchospasm: Secondary | ICD-10-CM | POA: Diagnosis not present

## 2016-07-07 DIAGNOSIS — J040 Acute laryngitis: Secondary | ICD-10-CM | POA: Diagnosis not present

## 2016-07-07 DIAGNOSIS — J309 Allergic rhinitis, unspecified: Secondary | ICD-10-CM | POA: Diagnosis not present

## 2016-07-07 DIAGNOSIS — R05 Cough: Secondary | ICD-10-CM | POA: Diagnosis not present

## 2016-07-10 DIAGNOSIS — J9801 Acute bronchospasm: Secondary | ICD-10-CM | POA: Diagnosis not present

## 2016-07-10 DIAGNOSIS — J040 Acute laryngitis: Secondary | ICD-10-CM | POA: Diagnosis not present

## 2016-07-10 DIAGNOSIS — R05 Cough: Secondary | ICD-10-CM | POA: Diagnosis not present

## 2016-07-11 DIAGNOSIS — M5416 Radiculopathy, lumbar region: Secondary | ICD-10-CM | POA: Diagnosis not present

## 2016-07-11 DIAGNOSIS — M461 Sacroiliitis, not elsewhere classified: Secondary | ICD-10-CM | POA: Diagnosis not present

## 2016-07-11 DIAGNOSIS — M545 Low back pain: Secondary | ICD-10-CM | POA: Diagnosis not present

## 2016-07-18 DIAGNOSIS — M8580 Other specified disorders of bone density and structure, unspecified site: Secondary | ICD-10-CM | POA: Diagnosis not present

## 2016-07-18 DIAGNOSIS — M899 Disorder of bone, unspecified: Secondary | ICD-10-CM | POA: Diagnosis not present

## 2016-07-18 DIAGNOSIS — M5030 Other cervical disc degeneration, unspecified cervical region: Secondary | ICD-10-CM | POA: Diagnosis not present

## 2016-07-18 DIAGNOSIS — Z1289 Encounter for screening for malignant neoplasm of other sites: Secondary | ICD-10-CM | POA: Diagnosis not present

## 2016-07-18 DIAGNOSIS — E7800 Pure hypercholesterolemia, unspecified: Secondary | ICD-10-CM | POA: Diagnosis not present

## 2016-07-18 DIAGNOSIS — Z0000 Encounter for general adult medical examination without abnormal findings: Secondary | ICD-10-CM | POA: Diagnosis not present

## 2016-07-18 DIAGNOSIS — J45909 Unspecified asthma, uncomplicated: Secondary | ICD-10-CM | POA: Diagnosis not present

## 2016-07-27 DIAGNOSIS — Z1211 Encounter for screening for malignant neoplasm of colon: Secondary | ICD-10-CM | POA: Diagnosis not present

## 2016-07-27 DIAGNOSIS — Z79899 Other long term (current) drug therapy: Secondary | ICD-10-CM | POA: Diagnosis not present

## 2016-08-24 DIAGNOSIS — R799 Abnormal finding of blood chemistry, unspecified: Secondary | ICD-10-CM | POA: Diagnosis not present

## 2016-08-29 DIAGNOSIS — Z780 Asymptomatic menopausal state: Secondary | ICD-10-CM | POA: Diagnosis not present

## 2016-09-12 DIAGNOSIS — R928 Other abnormal and inconclusive findings on diagnostic imaging of breast: Secondary | ICD-10-CM | POA: Diagnosis not present

## 2016-09-19 DIAGNOSIS — Z23 Encounter for immunization: Secondary | ICD-10-CM | POA: Diagnosis not present

## 2016-11-24 DIAGNOSIS — M545 Low back pain: Secondary | ICD-10-CM | POA: Diagnosis not present

## 2016-11-24 DIAGNOSIS — M5416 Radiculopathy, lumbar region: Secondary | ICD-10-CM | POA: Diagnosis not present

## 2016-11-24 DIAGNOSIS — M461 Sacroiliitis, not elsewhere classified: Secondary | ICD-10-CM | POA: Diagnosis not present

## 2016-11-24 DIAGNOSIS — M25551 Pain in right hip: Secondary | ICD-10-CM | POA: Diagnosis not present

## 2016-12-12 DIAGNOSIS — J309 Allergic rhinitis, unspecified: Secondary | ICD-10-CM | POA: Diagnosis not present

## 2016-12-29 DIAGNOSIS — H524 Presbyopia: Secondary | ICD-10-CM | POA: Diagnosis not present

## 2016-12-29 DIAGNOSIS — H2513 Age-related nuclear cataract, bilateral: Secondary | ICD-10-CM | POA: Diagnosis not present

## 2016-12-29 DIAGNOSIS — H52223 Regular astigmatism, bilateral: Secondary | ICD-10-CM | POA: Diagnosis not present

## 2016-12-29 DIAGNOSIS — H5203 Hypermetropia, bilateral: Secondary | ICD-10-CM | POA: Diagnosis not present

## 2016-12-29 DIAGNOSIS — H04123 Dry eye syndrome of bilateral lacrimal glands: Secondary | ICD-10-CM | POA: Diagnosis not present

## 2017-01-02 DIAGNOSIS — M5030 Other cervical disc degeneration, unspecified cervical region: Secondary | ICD-10-CM | POA: Diagnosis not present

## 2017-01-02 DIAGNOSIS — J45909 Unspecified asthma, uncomplicated: Secondary | ICD-10-CM | POA: Diagnosis not present

## 2017-01-02 DIAGNOSIS — D72829 Elevated white blood cell count, unspecified: Secondary | ICD-10-CM | POA: Diagnosis not present

## 2017-01-02 DIAGNOSIS — E7800 Pure hypercholesterolemia, unspecified: Secondary | ICD-10-CM | POA: Diagnosis not present

## 2017-03-27 DIAGNOSIS — H16223 Keratoconjunctivitis sicca, not specified as Sjogren's, bilateral: Secondary | ICD-10-CM | POA: Diagnosis not present

## 2017-03-27 DIAGNOSIS — H2513 Age-related nuclear cataract, bilateral: Secondary | ICD-10-CM | POA: Diagnosis not present

## 2017-05-01 DIAGNOSIS — R922 Inconclusive mammogram: Secondary | ICD-10-CM | POA: Diagnosis not present

## 2017-05-01 DIAGNOSIS — R928 Other abnormal and inconclusive findings on diagnostic imaging of breast: Secondary | ICD-10-CM | POA: Diagnosis not present

## 2017-05-01 DIAGNOSIS — R921 Mammographic calcification found on diagnostic imaging of breast: Secondary | ICD-10-CM | POA: Diagnosis not present

## 2017-05-08 DIAGNOSIS — H16223 Keratoconjunctivitis sicca, not specified as Sjogren's, bilateral: Secondary | ICD-10-CM | POA: Diagnosis not present

## 2017-05-08 DIAGNOSIS — H2513 Age-related nuclear cataract, bilateral: Secondary | ICD-10-CM | POA: Diagnosis not present

## 2017-06-05 DIAGNOSIS — L570 Actinic keratosis: Secondary | ICD-10-CM | POA: Diagnosis not present

## 2017-07-31 DIAGNOSIS — M8580 Other specified disorders of bone density and structure, unspecified site: Secondary | ICD-10-CM | POA: Diagnosis not present

## 2017-07-31 DIAGNOSIS — J45909 Unspecified asthma, uncomplicated: Secondary | ICD-10-CM | POA: Diagnosis not present

## 2017-07-31 DIAGNOSIS — M899 Disorder of bone, unspecified: Secondary | ICD-10-CM | POA: Diagnosis not present

## 2017-07-31 DIAGNOSIS — Z23 Encounter for immunization: Secondary | ICD-10-CM | POA: Diagnosis not present

## 2017-07-31 DIAGNOSIS — R5383 Other fatigue: Secondary | ICD-10-CM | POA: Diagnosis not present

## 2017-07-31 DIAGNOSIS — Z0000 Encounter for general adult medical examination without abnormal findings: Secondary | ICD-10-CM | POA: Diagnosis not present

## 2017-07-31 DIAGNOSIS — M5030 Other cervical disc degeneration, unspecified cervical region: Secondary | ICD-10-CM | POA: Diagnosis not present

## 2017-07-31 DIAGNOSIS — Z1231 Encounter for screening mammogram for malignant neoplasm of breast: Secondary | ICD-10-CM | POA: Diagnosis not present

## 2017-07-31 DIAGNOSIS — E7800 Pure hypercholesterolemia, unspecified: Secondary | ICD-10-CM | POA: Diagnosis not present

## 2017-08-21 DIAGNOSIS — L249 Irritant contact dermatitis, unspecified cause: Secondary | ICD-10-CM | POA: Diagnosis not present

## 2017-08-21 DIAGNOSIS — L570 Actinic keratosis: Secondary | ICD-10-CM | POA: Diagnosis not present

## 2017-08-21 DIAGNOSIS — E785 Hyperlipidemia, unspecified: Secondary | ICD-10-CM | POA: Diagnosis not present

## 2017-08-29 DIAGNOSIS — Z1211 Encounter for screening for malignant neoplasm of colon: Secondary | ICD-10-CM | POA: Diagnosis not present

## 2017-08-30 DIAGNOSIS — H16223 Keratoconjunctivitis sicca, not specified as Sjogren's, bilateral: Secondary | ICD-10-CM | POA: Diagnosis not present

## 2017-08-30 DIAGNOSIS — H52223 Regular astigmatism, bilateral: Secondary | ICD-10-CM | POA: Diagnosis not present

## 2017-08-30 DIAGNOSIS — H524 Presbyopia: Secondary | ICD-10-CM | POA: Diagnosis not present

## 2017-08-30 DIAGNOSIS — H5203 Hypermetropia, bilateral: Secondary | ICD-10-CM | POA: Diagnosis not present

## 2017-08-30 DIAGNOSIS — H2513 Age-related nuclear cataract, bilateral: Secondary | ICD-10-CM | POA: Diagnosis not present

## 2017-09-04 DIAGNOSIS — Z23 Encounter for immunization: Secondary | ICD-10-CM | POA: Diagnosis not present

## 2017-09-12 DIAGNOSIS — H8110 Benign paroxysmal vertigo, unspecified ear: Secondary | ICD-10-CM | POA: Diagnosis not present

## 2017-09-12 DIAGNOSIS — I6782 Cerebral ischemia: Secondary | ICD-10-CM | POA: Diagnosis not present

## 2017-09-12 DIAGNOSIS — Z7982 Long term (current) use of aspirin: Secondary | ICD-10-CM | POA: Diagnosis not present

## 2017-09-12 DIAGNOSIS — Z888 Allergy status to other drugs, medicaments and biological substances status: Secondary | ICD-10-CM | POA: Diagnosis not present

## 2017-09-12 DIAGNOSIS — R9082 White matter disease, unspecified: Secondary | ICD-10-CM | POA: Diagnosis not present

## 2017-09-12 DIAGNOSIS — R29818 Other symptoms and signs involving the nervous system: Secondary | ICD-10-CM | POA: Diagnosis not present

## 2017-09-12 DIAGNOSIS — Z79899 Other long term (current) drug therapy: Secondary | ICD-10-CM | POA: Diagnosis not present

## 2017-09-12 DIAGNOSIS — Z885 Allergy status to narcotic agent status: Secondary | ICD-10-CM | POA: Diagnosis not present

## 2017-09-14 DIAGNOSIS — R110 Nausea: Secondary | ICD-10-CM | POA: Diagnosis not present

## 2017-09-14 DIAGNOSIS — H838X3 Other specified diseases of inner ear, bilateral: Secondary | ICD-10-CM | POA: Diagnosis not present

## 2017-09-14 DIAGNOSIS — R030 Elevated blood-pressure reading, without diagnosis of hypertension: Secondary | ICD-10-CM | POA: Diagnosis not present

## 2017-09-14 DIAGNOSIS — H903 Sensorineural hearing loss, bilateral: Secondary | ICD-10-CM | POA: Diagnosis not present

## 2017-09-14 DIAGNOSIS — G43109 Migraine with aura, not intractable, without status migrainosus: Secondary | ICD-10-CM | POA: Diagnosis not present

## 2017-09-14 DIAGNOSIS — R42 Dizziness and giddiness: Secondary | ICD-10-CM | POA: Diagnosis not present

## 2017-10-25 DIAGNOSIS — L300 Nummular dermatitis: Secondary | ICD-10-CM | POA: Diagnosis not present

## 2017-11-20 DIAGNOSIS — H2513 Age-related nuclear cataract, bilateral: Secondary | ICD-10-CM | POA: Diagnosis not present

## 2017-11-20 DIAGNOSIS — H04123 Dry eye syndrome of bilateral lacrimal glands: Secondary | ICD-10-CM | POA: Diagnosis not present

## 2017-12-06 DIAGNOSIS — H2513 Age-related nuclear cataract, bilateral: Secondary | ICD-10-CM | POA: Diagnosis not present

## 2017-12-06 DIAGNOSIS — H04123 Dry eye syndrome of bilateral lacrimal glands: Secondary | ICD-10-CM | POA: Diagnosis not present

## 2017-12-11 DIAGNOSIS — H2513 Age-related nuclear cataract, bilateral: Secondary | ICD-10-CM | POA: Diagnosis not present

## 2018-01-07 DIAGNOSIS — Z79899 Other long term (current) drug therapy: Secondary | ICD-10-CM | POA: Diagnosis not present

## 2018-01-07 DIAGNOSIS — Z7982 Long term (current) use of aspirin: Secondary | ICD-10-CM | POA: Diagnosis not present

## 2018-01-07 DIAGNOSIS — J45909 Unspecified asthma, uncomplicated: Secondary | ICD-10-CM | POA: Diagnosis not present

## 2018-01-07 DIAGNOSIS — H53133 Sudden visual loss, bilateral: Secondary | ICD-10-CM | POA: Diagnosis not present

## 2018-01-07 DIAGNOSIS — H2512 Age-related nuclear cataract, left eye: Secondary | ICD-10-CM | POA: Diagnosis not present

## 2018-01-07 DIAGNOSIS — Z886 Allergy status to analgesic agent status: Secondary | ICD-10-CM | POA: Diagnosis not present

## 2018-02-04 DIAGNOSIS — Z9842 Cataract extraction status, left eye: Secondary | ICD-10-CM | POA: Diagnosis not present

## 2018-02-04 DIAGNOSIS — H52201 Unspecified astigmatism, right eye: Secondary | ICD-10-CM | POA: Diagnosis not present

## 2018-02-04 DIAGNOSIS — Z79899 Other long term (current) drug therapy: Secondary | ICD-10-CM | POA: Diagnosis not present

## 2018-02-04 DIAGNOSIS — J45909 Unspecified asthma, uncomplicated: Secondary | ICD-10-CM | POA: Diagnosis not present

## 2018-02-04 DIAGNOSIS — Z885 Allergy status to narcotic agent status: Secondary | ICD-10-CM | POA: Diagnosis not present

## 2018-02-04 DIAGNOSIS — Z888 Allergy status to other drugs, medicaments and biological substances status: Secondary | ICD-10-CM | POA: Diagnosis not present

## 2018-02-04 DIAGNOSIS — Z961 Presence of intraocular lens: Secondary | ICD-10-CM | POA: Diagnosis not present

## 2018-02-04 DIAGNOSIS — H2511 Age-related nuclear cataract, right eye: Secondary | ICD-10-CM | POA: Diagnosis not present

## 2018-02-12 DIAGNOSIS — R635 Abnormal weight gain: Secondary | ICD-10-CM | POA: Diagnosis not present

## 2018-02-12 DIAGNOSIS — M8580 Other specified disorders of bone density and structure, unspecified site: Secondary | ICD-10-CM | POA: Diagnosis not present

## 2018-02-12 DIAGNOSIS — E7800 Pure hypercholesterolemia, unspecified: Secondary | ICD-10-CM | POA: Diagnosis not present

## 2018-02-12 DIAGNOSIS — R5383 Other fatigue: Secondary | ICD-10-CM | POA: Diagnosis not present

## 2018-02-12 DIAGNOSIS — R739 Hyperglycemia, unspecified: Secondary | ICD-10-CM | POA: Diagnosis not present

## 2018-02-12 DIAGNOSIS — J45909 Unspecified asthma, uncomplicated: Secondary | ICD-10-CM | POA: Diagnosis not present

## 2018-04-16 DIAGNOSIS — L300 Nummular dermatitis: Secondary | ICD-10-CM | POA: Diagnosis not present

## 2018-04-23 DIAGNOSIS — H10013 Acute follicular conjunctivitis, bilateral: Secondary | ICD-10-CM | POA: Diagnosis not present

## 2018-05-02 DIAGNOSIS — H10013 Acute follicular conjunctivitis, bilateral: Secondary | ICD-10-CM | POA: Diagnosis not present

## 2018-05-14 DIAGNOSIS — Z1231 Encounter for screening mammogram for malignant neoplasm of breast: Secondary | ICD-10-CM | POA: Diagnosis not present

## 2018-05-21 DIAGNOSIS — J9801 Acute bronchospasm: Secondary | ICD-10-CM | POA: Diagnosis not present

## 2018-05-21 DIAGNOSIS — J309 Allergic rhinitis, unspecified: Secondary | ICD-10-CM | POA: Diagnosis not present

## 2018-06-26 DIAGNOSIS — H16293 Other keratoconjunctivitis, bilateral: Secondary | ICD-10-CM | POA: Diagnosis not present

## 2018-07-04 DIAGNOSIS — H16293 Other keratoconjunctivitis, bilateral: Secondary | ICD-10-CM | POA: Diagnosis not present

## 2018-07-05 DIAGNOSIS — H04122 Dry eye syndrome of left lacrimal gland: Secondary | ICD-10-CM | POA: Diagnosis not present

## 2018-07-05 DIAGNOSIS — H18002 Unspecified corneal deposit, left eye: Secondary | ICD-10-CM | POA: Diagnosis not present

## 2018-07-16 DIAGNOSIS — M79672 Pain in left foot: Secondary | ICD-10-CM | POA: Diagnosis not present

## 2018-08-09 DIAGNOSIS — M79672 Pain in left foot: Secondary | ICD-10-CM | POA: Diagnosis not present

## 2018-08-13 DIAGNOSIS — H18002 Unspecified corneal deposit, left eye: Secondary | ICD-10-CM | POA: Diagnosis not present

## 2018-08-13 DIAGNOSIS — H04122 Dry eye syndrome of left lacrimal gland: Secondary | ICD-10-CM | POA: Diagnosis not present

## 2018-09-05 DIAGNOSIS — M899 Disorder of bone, unspecified: Secondary | ICD-10-CM | POA: Diagnosis not present

## 2018-09-05 DIAGNOSIS — R5383 Other fatigue: Secondary | ICD-10-CM | POA: Diagnosis not present

## 2018-09-05 DIAGNOSIS — Z0000 Encounter for general adult medical examination without abnormal findings: Secondary | ICD-10-CM | POA: Diagnosis not present

## 2018-09-05 DIAGNOSIS — K5900 Constipation, unspecified: Secondary | ICD-10-CM | POA: Diagnosis not present

## 2018-09-05 DIAGNOSIS — Z23 Encounter for immunization: Secondary | ICD-10-CM | POA: Diagnosis not present

## 2018-09-05 DIAGNOSIS — M5030 Other cervical disc degeneration, unspecified cervical region: Secondary | ICD-10-CM | POA: Diagnosis not present

## 2018-09-05 DIAGNOSIS — Z1231 Encounter for screening mammogram for malignant neoplasm of breast: Secondary | ICD-10-CM | POA: Diagnosis not present

## 2018-09-05 DIAGNOSIS — M8580 Other specified disorders of bone density and structure, unspecified site: Secondary | ICD-10-CM | POA: Diagnosis not present

## 2018-09-05 DIAGNOSIS — E7800 Pure hypercholesterolemia, unspecified: Secondary | ICD-10-CM | POA: Diagnosis not present

## 2018-10-03 DIAGNOSIS — I8311 Varicose veins of right lower extremity with inflammation: Secondary | ICD-10-CM | POA: Diagnosis not present

## 2018-10-03 DIAGNOSIS — M79605 Pain in left leg: Secondary | ICD-10-CM | POA: Diagnosis not present

## 2018-10-03 DIAGNOSIS — M79604 Pain in right leg: Secondary | ICD-10-CM | POA: Diagnosis not present

## 2018-10-03 DIAGNOSIS — I8312 Varicose veins of left lower extremity with inflammation: Secondary | ICD-10-CM | POA: Diagnosis not present

## 2018-10-07 DIAGNOSIS — I8312 Varicose veins of left lower extremity with inflammation: Secondary | ICD-10-CM | POA: Diagnosis not present

## 2018-10-07 DIAGNOSIS — I8311 Varicose veins of right lower extremity with inflammation: Secondary | ICD-10-CM | POA: Diagnosis not present

## 2018-10-07 DIAGNOSIS — M79605 Pain in left leg: Secondary | ICD-10-CM | POA: Diagnosis not present

## 2018-10-07 DIAGNOSIS — M79604 Pain in right leg: Secondary | ICD-10-CM | POA: Diagnosis not present

## 2018-10-10 DIAGNOSIS — I8312 Varicose veins of left lower extremity with inflammation: Secondary | ICD-10-CM | POA: Diagnosis not present

## 2018-10-22 DIAGNOSIS — M85851 Other specified disorders of bone density and structure, right thigh: Secondary | ICD-10-CM | POA: Diagnosis not present

## 2018-10-22 DIAGNOSIS — Z13820 Encounter for screening for osteoporosis: Secondary | ICD-10-CM | POA: Diagnosis not present

## 2018-10-22 DIAGNOSIS — Z780 Asymptomatic menopausal state: Secondary | ICD-10-CM | POA: Diagnosis not present

## 2018-10-22 DIAGNOSIS — M8589 Other specified disorders of bone density and structure, multiple sites: Secondary | ICD-10-CM | POA: Diagnosis not present

## 2018-11-07 DIAGNOSIS — I8312 Varicose veins of left lower extremity with inflammation: Secondary | ICD-10-CM | POA: Diagnosis not present

## 2018-12-13 DIAGNOSIS — I8312 Varicose veins of left lower extremity with inflammation: Secondary | ICD-10-CM | POA: Diagnosis not present

## 2018-12-13 DIAGNOSIS — M7981 Nontraumatic hematoma of soft tissue: Secondary | ICD-10-CM | POA: Diagnosis not present

## 2019-02-11 DIAGNOSIS — R69 Illness, unspecified: Secondary | ICD-10-CM | POA: Diagnosis not present

## 2019-02-25 DIAGNOSIS — Z808 Family history of malignant neoplasm of other organs or systems: Secondary | ICD-10-CM | POA: Diagnosis not present

## 2019-02-25 DIAGNOSIS — L821 Other seborrheic keratosis: Secondary | ICD-10-CM | POA: Diagnosis not present

## 2019-02-25 DIAGNOSIS — L814 Other melanin hyperpigmentation: Secondary | ICD-10-CM | POA: Diagnosis not present

## 2019-02-25 DIAGNOSIS — L570 Actinic keratosis: Secondary | ICD-10-CM | POA: Diagnosis not present

## 2019-03-06 DIAGNOSIS — H52223 Regular astigmatism, bilateral: Secondary | ICD-10-CM | POA: Diagnosis not present

## 2019-03-11 DIAGNOSIS — M8580 Other specified disorders of bone density and structure, unspecified site: Secondary | ICD-10-CM | POA: Diagnosis not present

## 2019-03-11 DIAGNOSIS — E7800 Pure hypercholesterolemia, unspecified: Secondary | ICD-10-CM | POA: Diagnosis not present

## 2019-03-11 DIAGNOSIS — M5030 Other cervical disc degeneration, unspecified cervical region: Secondary | ICD-10-CM | POA: Diagnosis not present

## 2019-03-11 DIAGNOSIS — J45909 Unspecified asthma, uncomplicated: Secondary | ICD-10-CM | POA: Diagnosis not present

## 2019-05-13 DIAGNOSIS — H18832 Recurrent erosion of cornea, left eye: Secondary | ICD-10-CM | POA: Diagnosis not present

## 2019-05-20 DIAGNOSIS — H18832 Recurrent erosion of cornea, left eye: Secondary | ICD-10-CM | POA: Diagnosis not present

## 2019-06-03 DIAGNOSIS — M79672 Pain in left foot: Secondary | ICD-10-CM | POA: Diagnosis not present

## 2019-06-09 DIAGNOSIS — M79672 Pain in left foot: Secondary | ICD-10-CM | POA: Diagnosis not present

## 2019-07-31 DIAGNOSIS — U071 COVID-19: Secondary | ICD-10-CM | POA: Diagnosis not present

## 2019-07-31 DIAGNOSIS — R6889 Other general symptoms and signs: Secondary | ICD-10-CM | POA: Diagnosis not present

## 2019-07-31 DIAGNOSIS — Z20828 Contact with and (suspected) exposure to other viral communicable diseases: Secondary | ICD-10-CM | POA: Diagnosis not present

## 2019-08-19 DIAGNOSIS — R69 Illness, unspecified: Secondary | ICD-10-CM | POA: Diagnosis not present

## 2019-08-26 DIAGNOSIS — Z1231 Encounter for screening mammogram for malignant neoplasm of breast: Secondary | ICD-10-CM | POA: Diagnosis not present

## 2019-08-26 DIAGNOSIS — H18832 Recurrent erosion of cornea, left eye: Secondary | ICD-10-CM | POA: Diagnosis not present

## 2019-10-17 DIAGNOSIS — Z23 Encounter for immunization: Secondary | ICD-10-CM | POA: Diagnosis not present

## 2019-11-27 DIAGNOSIS — H18832 Recurrent erosion of cornea, left eye: Secondary | ICD-10-CM | POA: Diagnosis not present

## 2019-11-27 DIAGNOSIS — Z961 Presence of intraocular lens: Secondary | ICD-10-CM | POA: Diagnosis not present

## 2019-12-16 DIAGNOSIS — J45909 Unspecified asthma, uncomplicated: Secondary | ICD-10-CM | POA: Diagnosis not present

## 2019-12-16 DIAGNOSIS — Z01419 Encounter for gynecological examination (general) (routine) without abnormal findings: Secondary | ICD-10-CM | POA: Diagnosis not present

## 2019-12-16 DIAGNOSIS — Z0000 Encounter for general adult medical examination without abnormal findings: Secondary | ICD-10-CM | POA: Diagnosis not present

## 2019-12-16 DIAGNOSIS — J309 Allergic rhinitis, unspecified: Secondary | ICD-10-CM | POA: Diagnosis not present

## 2019-12-16 DIAGNOSIS — E7800 Pure hypercholesterolemia, unspecified: Secondary | ICD-10-CM | POA: Diagnosis not present

## 2019-12-16 DIAGNOSIS — M8580 Other specified disorders of bone density and structure, unspecified site: Secondary | ICD-10-CM | POA: Diagnosis not present

## 2019-12-17 DIAGNOSIS — Z1211 Encounter for screening for malignant neoplasm of colon: Secondary | ICD-10-CM | POA: Diagnosis not present

## 2019-12-17 DIAGNOSIS — M8580 Other specified disorders of bone density and structure, unspecified site: Secondary | ICD-10-CM | POA: Diagnosis not present

## 2019-12-17 DIAGNOSIS — E7800 Pure hypercholesterolemia, unspecified: Secondary | ICD-10-CM | POA: Diagnosis not present

## 2019-12-17 DIAGNOSIS — R5383 Other fatigue: Secondary | ICD-10-CM | POA: Diagnosis not present

## 2019-12-17 DIAGNOSIS — M899 Disorder of bone, unspecified: Secondary | ICD-10-CM | POA: Diagnosis not present

## 2020-01-22 DIAGNOSIS — L821 Other seborrheic keratosis: Secondary | ICD-10-CM | POA: Diagnosis not present

## 2020-01-22 DIAGNOSIS — L814 Other melanin hyperpigmentation: Secondary | ICD-10-CM | POA: Diagnosis not present

## 2020-01-22 DIAGNOSIS — L570 Actinic keratosis: Secondary | ICD-10-CM | POA: Diagnosis not present

## 2020-01-22 DIAGNOSIS — Z808 Family history of malignant neoplasm of other organs or systems: Secondary | ICD-10-CM | POA: Diagnosis not present

## 2020-01-22 DIAGNOSIS — X32XXXS Exposure to sunlight, sequela: Secondary | ICD-10-CM | POA: Diagnosis not present

## 2020-01-22 DIAGNOSIS — D1801 Hemangioma of skin and subcutaneous tissue: Secondary | ICD-10-CM | POA: Diagnosis not present

## 2020-02-23 DIAGNOSIS — M79672 Pain in left foot: Secondary | ICD-10-CM | POA: Diagnosis not present

## 2020-03-02 DIAGNOSIS — R69 Illness, unspecified: Secondary | ICD-10-CM | POA: Diagnosis not present

## 2020-03-09 DIAGNOSIS — M545 Low back pain: Secondary | ICD-10-CM | POA: Diagnosis not present

## 2020-03-19 DIAGNOSIS — M25552 Pain in left hip: Secondary | ICD-10-CM | POA: Diagnosis not present

## 2020-03-19 DIAGNOSIS — M25551 Pain in right hip: Secondary | ICD-10-CM | POA: Diagnosis not present

## 2020-03-19 DIAGNOSIS — R6889 Other general symptoms and signs: Secondary | ICD-10-CM | POA: Diagnosis not present

## 2020-06-17 DIAGNOSIS — J45909 Unspecified asthma, uncomplicated: Secondary | ICD-10-CM | POA: Diagnosis not present

## 2020-06-17 DIAGNOSIS — J309 Allergic rhinitis, unspecified: Secondary | ICD-10-CM | POA: Diagnosis not present

## 2020-06-17 DIAGNOSIS — M5030 Other cervical disc degeneration, unspecified cervical region: Secondary | ICD-10-CM | POA: Diagnosis not present

## 2020-06-17 DIAGNOSIS — E7800 Pure hypercholesterolemia, unspecified: Secondary | ICD-10-CM | POA: Diagnosis not present

## 2020-06-29 DIAGNOSIS — M79672 Pain in left foot: Secondary | ICD-10-CM | POA: Diagnosis not present

## 2020-07-12 DIAGNOSIS — M79672 Pain in left foot: Secondary | ICD-10-CM | POA: Diagnosis not present

## 2020-09-14 DIAGNOSIS — R69 Illness, unspecified: Secondary | ICD-10-CM | POA: Diagnosis not present

## 2020-10-25 DIAGNOSIS — Z1231 Encounter for screening mammogram for malignant neoplasm of breast: Secondary | ICD-10-CM | POA: Diagnosis not present

## 2020-10-25 DIAGNOSIS — Z23 Encounter for immunization: Secondary | ICD-10-CM | POA: Diagnosis not present

## 2020-11-30 DIAGNOSIS — M79672 Pain in left foot: Secondary | ICD-10-CM | POA: Diagnosis not present

## 2020-12-06 DIAGNOSIS — M1811 Unilateral primary osteoarthritis of first carpometacarpal joint, right hand: Secondary | ICD-10-CM | POA: Diagnosis not present

## 2020-12-06 DIAGNOSIS — M79641 Pain in right hand: Secondary | ICD-10-CM | POA: Diagnosis not present

## 2020-12-13 DIAGNOSIS — J309 Allergic rhinitis, unspecified: Secondary | ICD-10-CM | POA: Diagnosis not present

## 2020-12-13 DIAGNOSIS — Z0000 Encounter for general adult medical examination without abnormal findings: Secondary | ICD-10-CM | POA: Diagnosis not present

## 2020-12-13 DIAGNOSIS — J45909 Unspecified asthma, uncomplicated: Secondary | ICD-10-CM | POA: Diagnosis not present

## 2020-12-13 DIAGNOSIS — Z01419 Encounter for gynecological examination (general) (routine) without abnormal findings: Secondary | ICD-10-CM | POA: Diagnosis not present

## 2020-12-13 DIAGNOSIS — M5030 Other cervical disc degeneration, unspecified cervical region: Secondary | ICD-10-CM | POA: Diagnosis not present

## 2020-12-13 DIAGNOSIS — E7800 Pure hypercholesterolemia, unspecified: Secondary | ICD-10-CM | POA: Diagnosis not present

## 2020-12-14 DIAGNOSIS — E7800 Pure hypercholesterolemia, unspecified: Secondary | ICD-10-CM | POA: Diagnosis not present

## 2020-12-14 DIAGNOSIS — M8580 Other specified disorders of bone density and structure, unspecified site: Secondary | ICD-10-CM | POA: Diagnosis not present

## 2020-12-14 DIAGNOSIS — R5383 Other fatigue: Secondary | ICD-10-CM | POA: Diagnosis not present

## 2020-12-14 DIAGNOSIS — M899 Disorder of bone, unspecified: Secondary | ICD-10-CM | POA: Diagnosis not present

## 2021-05-30 ENCOUNTER — Other Ambulatory Visit: Payer: Self-pay

## 2021-05-30 ENCOUNTER — Other Ambulatory Visit: Payer: Self-pay | Admitting: Unknown Physician Specialty

## 2021-05-30 ENCOUNTER — Ambulatory Visit (INDEPENDENT_AMBULATORY_CARE_PROVIDER_SITE_OTHER): Payer: Medicare HMO

## 2021-05-30 DIAGNOSIS — R1030 Lower abdominal pain, unspecified: Secondary | ICD-10-CM

## 2022-11-18 IMAGING — DX DG ABDOMEN 2V
3 series · 3 of 3 positions shown · non-contrast
Comparison: None.

CLINICAL DATA: Fullness, nausea, belching for 3 days

EXAM:
ABDOMEN - 2 VIEW

[abdomen erect]
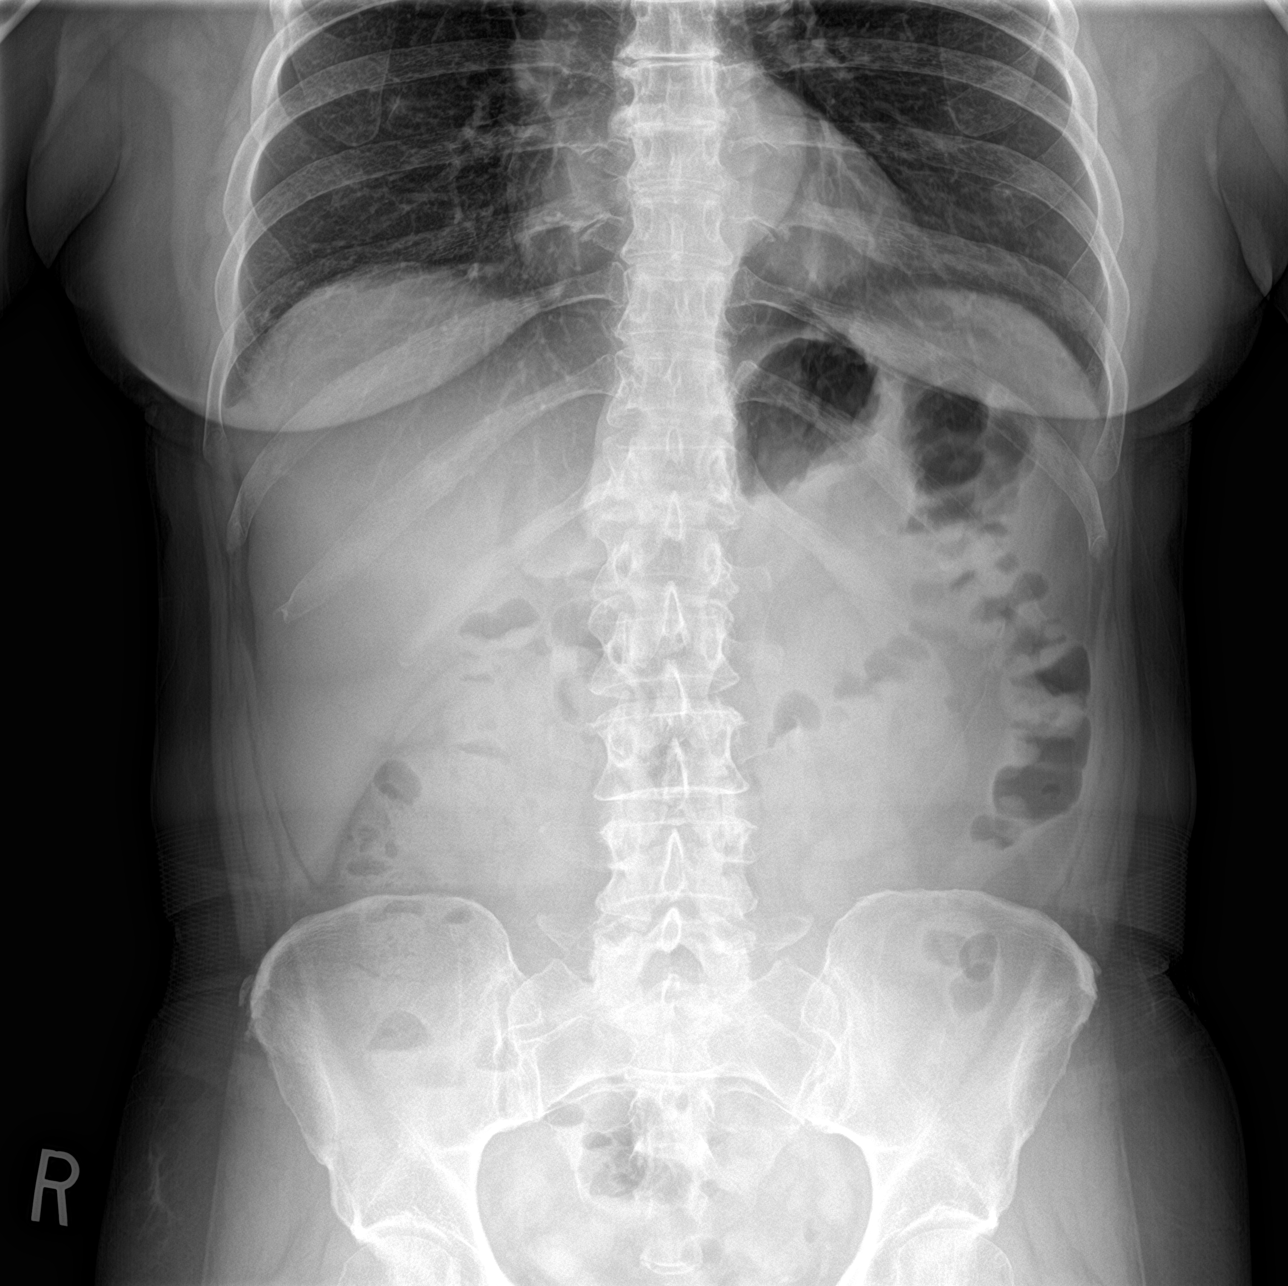

[abdomen supine (1 of 2)]
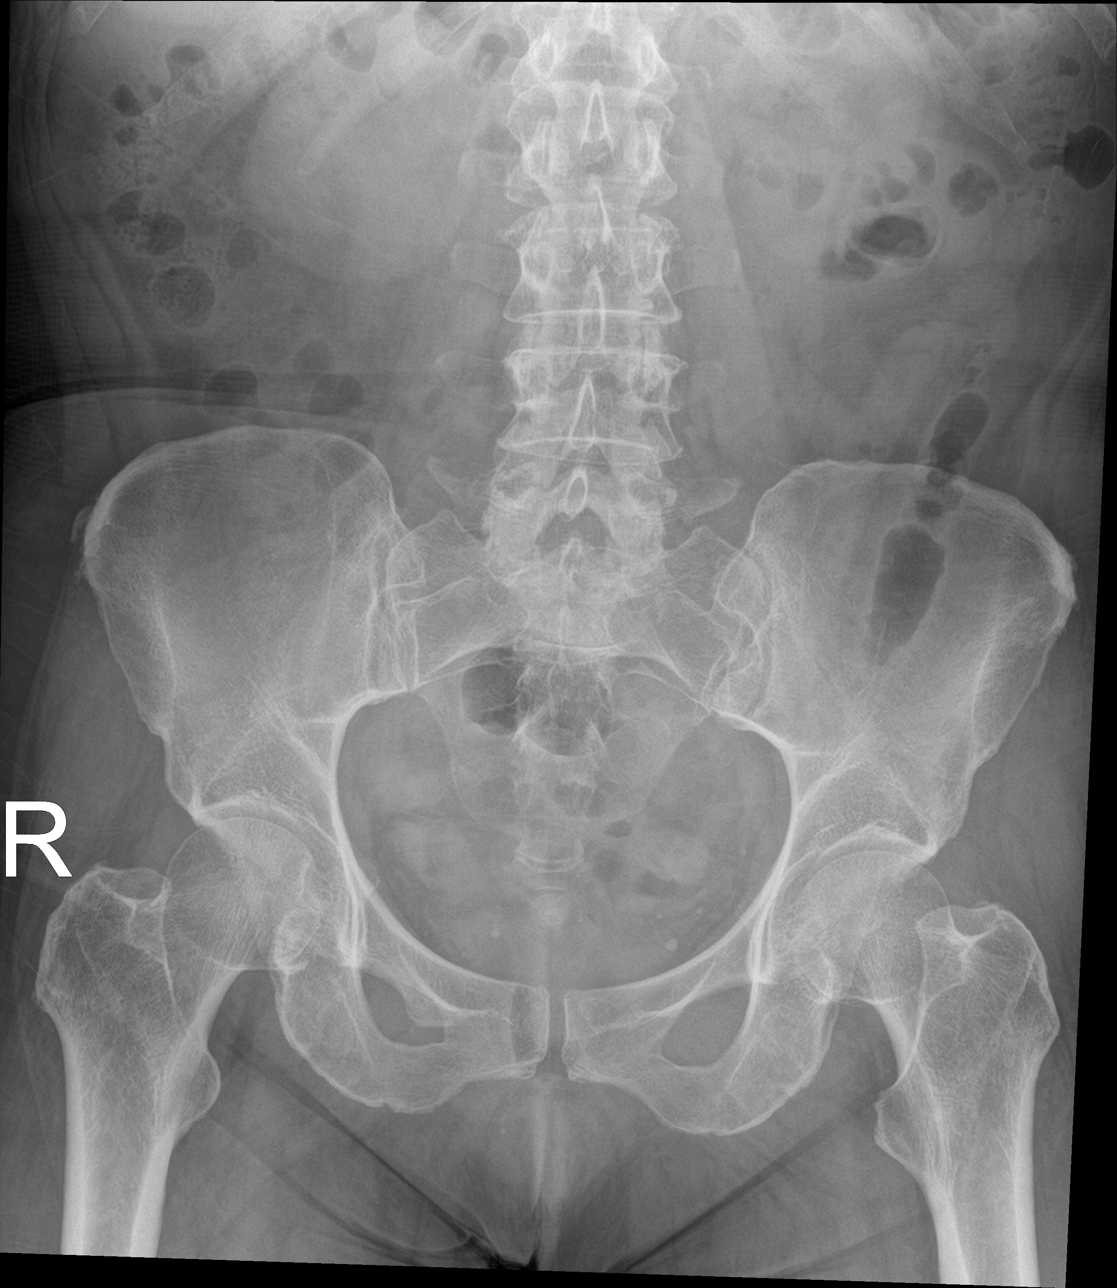

[abdomen supine (2 of 2)]
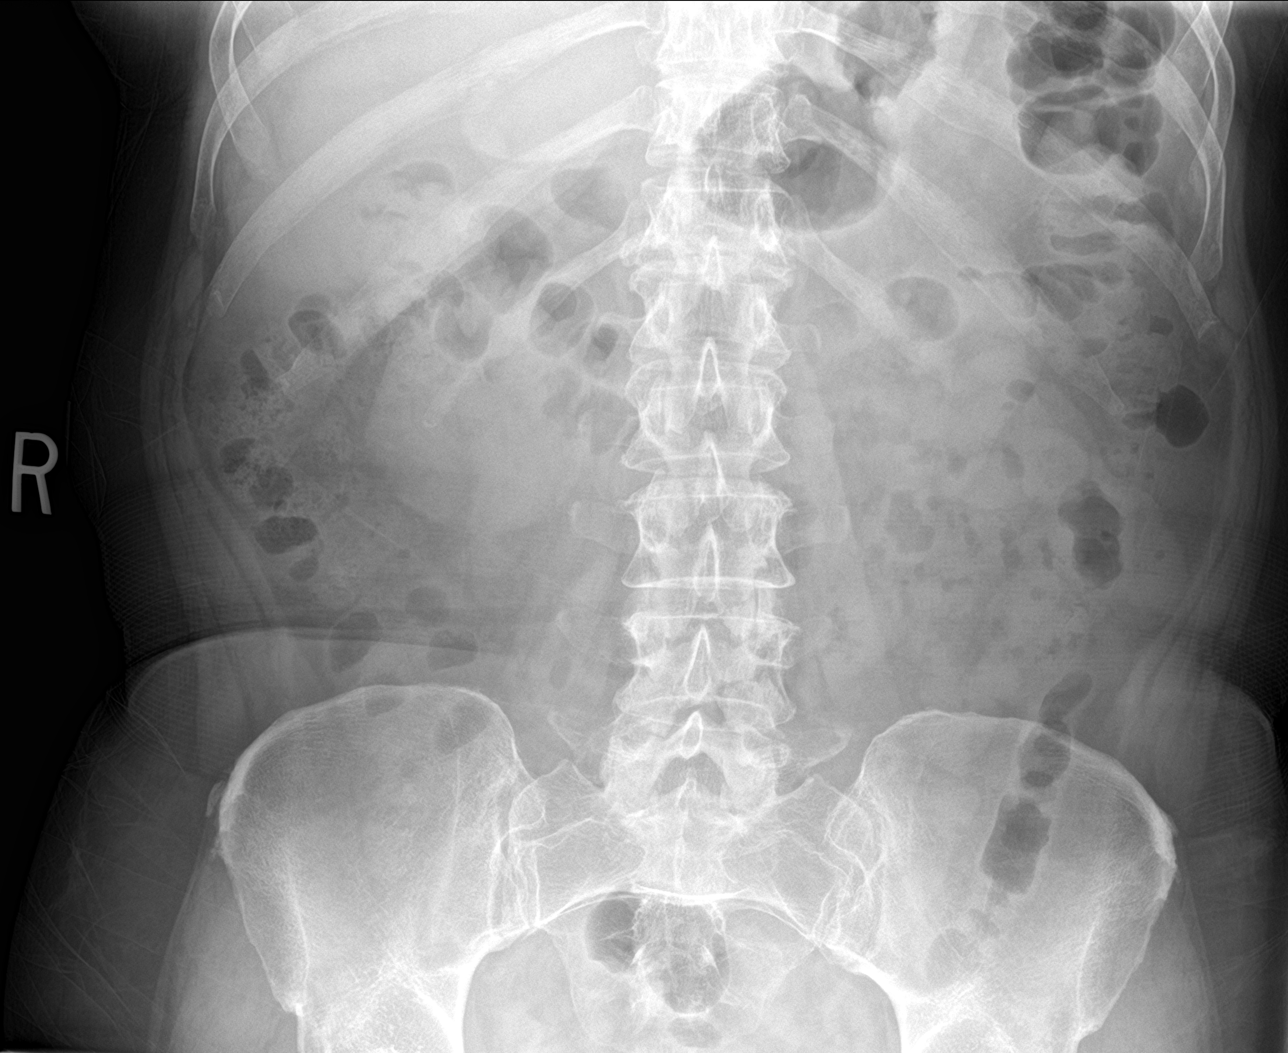

[3 of 3 positions shown; findings below may reference images not displayed]

FINDINGS: No disproportionately dilated small bowel loops or significant
air-fluid levels. Mild colonic stool. No evidence of pneumatosis or
pneumoperitoneum. Clear lung bases. No radiopaque nephrolithiasis.
Mild lumbar spondylosis.
IMPRESSION: Nonobstructive bowel gas pattern. Mild colonic stool.
